# Patient Record
Sex: Male | Born: 1935 | Race: White | Hispanic: No | Marital: Married | Smoking: Former smoker
Health system: Southern US, Community
[De-identification: ages and names within clinical notes are randomized; demographics above are authoritative.]

## PROBLEM LIST (undated history)

## (undated) DIAGNOSIS — I429 Cardiomyopathy, unspecified: Secondary | ICD-10-CM

## (undated) DIAGNOSIS — M1990 Unspecified osteoarthritis, unspecified site: Secondary | ICD-10-CM

## (undated) DIAGNOSIS — K529 Noninfective gastroenteritis and colitis, unspecified: Secondary | ICD-10-CM

## (undated) DIAGNOSIS — F32A Depression, unspecified: Secondary | ICD-10-CM

## (undated) DIAGNOSIS — R001 Bradycardia, unspecified: Secondary | ICD-10-CM

## (undated) DIAGNOSIS — I509 Heart failure, unspecified: Secondary | ICD-10-CM

## (undated) DIAGNOSIS — E559 Vitamin D deficiency, unspecified: Secondary | ICD-10-CM

## (undated) DIAGNOSIS — F329 Major depressive disorder, single episode, unspecified: Secondary | ICD-10-CM

## (undated) DIAGNOSIS — I4891 Unspecified atrial fibrillation: Secondary | ICD-10-CM

## (undated) DIAGNOSIS — J189 Pneumonia, unspecified organism: Secondary | ICD-10-CM

## (undated) DIAGNOSIS — Z950 Presence of cardiac pacemaker: Secondary | ICD-10-CM

## (undated) DIAGNOSIS — I4821 Permanent atrial fibrillation: Secondary | ICD-10-CM

## (undated) DIAGNOSIS — I340 Nonrheumatic mitral (valve) insufficiency: Secondary | ICD-10-CM

## (undated) DIAGNOSIS — I10 Essential (primary) hypertension: Secondary | ICD-10-CM

## (undated) DIAGNOSIS — S065X9A Traumatic subdural hemorrhage with loss of consciousness of unspecified duration, initial encounter: Secondary | ICD-10-CM

## (undated) HISTORY — DX: Essential (primary) hypertension: I10

## (undated) HISTORY — PX: HAND SURGERY: SHX662

## (undated) HISTORY — DX: Permanent atrial fibrillation: I48.21

## (undated) HISTORY — DX: Noninfective gastroenteritis and colitis, unspecified: K52.9

## (undated) HISTORY — DX: Bradycardia, unspecified: R00.1

## (undated) HISTORY — DX: Traumatic subdural hemorrhage with loss of consciousness of unspecified duration, initial encounter: S06.5X9A

## (undated) HISTORY — PX: TONSILLECTOMY AND ADENOIDECTOMY: SUR1326

## (undated) HISTORY — PX: SUBDURAL HEMATOMA EVACUATION VIA CRANIOTOMY: SUR319

## (undated) HISTORY — PX: CYSTECTOMY: SUR359

## (undated) HISTORY — PX: INSERT / REPLACE / REMOVE PACEMAKER: SUR710

---

## 1999-07-06 ENCOUNTER — Inpatient Hospital Stay (HOSPITAL_COMMUNITY): Admission: EM | Admit: 1999-07-06 | Discharge: 1999-07-09 | Payer: Self-pay | Admitting: Emergency Medicine

## 1999-07-06 ENCOUNTER — Encounter: Payer: Self-pay | Admitting: Emergency Medicine

## 1999-07-08 ENCOUNTER — Encounter: Payer: Self-pay | Admitting: Endocrinology

## 2000-04-10 ENCOUNTER — Ambulatory Visit (HOSPITAL_COMMUNITY): Admission: RE | Admit: 2000-04-10 | Discharge: 2000-04-10 | Payer: Self-pay | Admitting: Specialist

## 2000-04-24 ENCOUNTER — Inpatient Hospital Stay (HOSPITAL_COMMUNITY): Admission: EM | Admit: 2000-04-24 | Discharge: 2000-04-24 | Payer: Self-pay | Admitting: Emergency Medicine

## 2000-09-08 ENCOUNTER — Encounter: Payer: Self-pay | Admitting: Specialist

## 2000-09-08 ENCOUNTER — Encounter: Admission: RE | Admit: 2000-09-08 | Discharge: 2000-09-08 | Payer: Self-pay | Admitting: Specialist

## 2001-03-09 ENCOUNTER — Encounter: Admission: RE | Admit: 2001-03-09 | Discharge: 2001-04-27 | Payer: Self-pay | Admitting: Specialist

## 2001-03-16 ENCOUNTER — Encounter (INDEPENDENT_AMBULATORY_CARE_PROVIDER_SITE_OTHER): Payer: Self-pay | Admitting: Specialist

## 2001-03-16 ENCOUNTER — Other Ambulatory Visit: Admission: RE | Admit: 2001-03-16 | Discharge: 2001-03-16 | Payer: Self-pay | Admitting: Gastroenterology

## 2001-05-26 DIAGNOSIS — S065X9A Traumatic subdural hemorrhage with loss of consciousness of unspecified duration, initial encounter: Secondary | ICD-10-CM

## 2001-05-26 DIAGNOSIS — S065XAA Traumatic subdural hemorrhage with loss of consciousness status unknown, initial encounter: Secondary | ICD-10-CM

## 2001-05-26 HISTORY — DX: Traumatic subdural hemorrhage with loss of consciousness status unknown, initial encounter: S06.5XAA

## 2001-05-26 HISTORY — DX: Traumatic subdural hemorrhage with loss of consciousness of unspecified duration, initial encounter: S06.5X9A

## 2001-10-23 ENCOUNTER — Emergency Department (HOSPITAL_COMMUNITY): Admission: EM | Admit: 2001-10-23 | Discharge: 2001-10-24 | Payer: Self-pay | Admitting: Emergency Medicine

## 2002-04-25 ENCOUNTER — Inpatient Hospital Stay (HOSPITAL_COMMUNITY): Admission: EM | Admit: 2002-04-25 | Discharge: 2002-05-03 | Payer: Self-pay | Admitting: Emergency Medicine

## 2002-04-25 ENCOUNTER — Encounter: Payer: Self-pay | Admitting: Emergency Medicine

## 2002-04-25 ENCOUNTER — Encounter: Payer: Self-pay | Admitting: Neurosurgery

## 2002-04-26 ENCOUNTER — Encounter (INDEPENDENT_AMBULATORY_CARE_PROVIDER_SITE_OTHER): Payer: Self-pay | Admitting: *Deleted

## 2002-04-29 ENCOUNTER — Encounter: Payer: Self-pay | Admitting: Neurosurgery

## 2002-05-03 ENCOUNTER — Inpatient Hospital Stay (HOSPITAL_COMMUNITY)
Admission: RE | Admit: 2002-05-03 | Discharge: 2002-05-12 | Payer: Self-pay | Admitting: Physical Medicine & Rehabilitation

## 2002-05-12 ENCOUNTER — Inpatient Hospital Stay (HOSPITAL_COMMUNITY): Admission: EM | Admit: 2002-05-12 | Discharge: 2002-05-23 | Payer: Self-pay | Admitting: Cardiology

## 2002-05-23 ENCOUNTER — Inpatient Hospital Stay (HOSPITAL_COMMUNITY)
Admission: RE | Admit: 2002-05-23 | Discharge: 2002-05-31 | Payer: Self-pay | Admitting: Physical Medicine & Rehabilitation

## 2003-09-27 ENCOUNTER — Ambulatory Visit (HOSPITAL_COMMUNITY): Admission: RE | Admit: 2003-09-27 | Discharge: 2003-09-27 | Payer: Self-pay | Admitting: *Deleted

## 2003-10-10 ENCOUNTER — Encounter: Admission: RE | Admit: 2003-10-10 | Discharge: 2003-10-10 | Payer: Self-pay | Admitting: *Deleted

## 2003-10-11 ENCOUNTER — Encounter (INDEPENDENT_AMBULATORY_CARE_PROVIDER_SITE_OTHER): Payer: Self-pay | Admitting: Specialist

## 2003-10-11 ENCOUNTER — Ambulatory Visit (HOSPITAL_BASED_OUTPATIENT_CLINIC_OR_DEPARTMENT_OTHER): Admission: RE | Admit: 2003-10-11 | Discharge: 2003-10-11 | Payer: Self-pay | Admitting: *Deleted

## 2003-10-11 ENCOUNTER — Ambulatory Visit (HOSPITAL_COMMUNITY): Admission: RE | Admit: 2003-10-11 | Discharge: 2003-10-11 | Payer: Self-pay | Admitting: *Deleted

## 2004-01-13 ENCOUNTER — Emergency Department (HOSPITAL_COMMUNITY): Admission: EM | Admit: 2004-01-13 | Discharge: 2004-01-13 | Payer: Self-pay | Admitting: Emergency Medicine

## 2004-06-05 ENCOUNTER — Ambulatory Visit: Payer: Self-pay | Admitting: Internal Medicine

## 2004-09-10 ENCOUNTER — Ambulatory Visit: Payer: Self-pay | Admitting: Internal Medicine

## 2004-09-25 ENCOUNTER — Ambulatory Visit: Payer: Self-pay | Admitting: Gastroenterology

## 2004-09-25 ENCOUNTER — Ambulatory Visit (HOSPITAL_COMMUNITY): Admission: RE | Admit: 2004-09-25 | Discharge: 2004-09-25 | Payer: Self-pay | Admitting: Gastroenterology

## 2004-12-10 ENCOUNTER — Ambulatory Visit: Payer: Self-pay | Admitting: Internal Medicine

## 2005-03-10 ENCOUNTER — Ambulatory Visit: Payer: Self-pay | Admitting: Internal Medicine

## 2005-09-11 ENCOUNTER — Encounter: Admission: RE | Admit: 2005-09-11 | Discharge: 2005-10-13 | Payer: Self-pay | Admitting: Neurology

## 2005-09-17 ENCOUNTER — Ambulatory Visit: Payer: Self-pay | Admitting: Gastroenterology

## 2005-10-14 ENCOUNTER — Encounter: Admission: RE | Admit: 2005-10-14 | Discharge: 2006-01-12 | Payer: Self-pay | Admitting: Neurology

## 2005-11-24 ENCOUNTER — Ambulatory Visit: Payer: Self-pay

## 2006-06-08 ENCOUNTER — Ambulatory Visit: Payer: Self-pay | Admitting: Internal Medicine

## 2006-06-08 ENCOUNTER — Ambulatory Visit: Payer: Self-pay

## 2006-06-30 ENCOUNTER — Ambulatory Visit: Payer: Self-pay | Admitting: Gastroenterology

## 2006-07-11 ENCOUNTER — Ambulatory Visit: Payer: Self-pay | Admitting: Internal Medicine

## 2006-07-22 ENCOUNTER — Ambulatory Visit: Payer: Self-pay | Admitting: Gastroenterology

## 2006-07-22 ENCOUNTER — Encounter (INDEPENDENT_AMBULATORY_CARE_PROVIDER_SITE_OTHER): Payer: Self-pay | Admitting: *Deleted

## 2006-08-05 ENCOUNTER — Ambulatory Visit: Payer: Self-pay | Admitting: Internal Medicine

## 2006-09-02 ENCOUNTER — Ambulatory Visit: Payer: Self-pay | Admitting: Internal Medicine

## 2006-10-07 ENCOUNTER — Ambulatory Visit: Payer: Self-pay | Admitting: Internal Medicine

## 2006-11-04 ENCOUNTER — Ambulatory Visit: Payer: Self-pay | Admitting: Internal Medicine

## 2006-12-02 ENCOUNTER — Ambulatory Visit: Payer: Self-pay | Admitting: Internal Medicine

## 2007-01-06 ENCOUNTER — Ambulatory Visit: Payer: Self-pay | Admitting: Internal Medicine

## 2007-01-21 ENCOUNTER — Encounter: Admission: RE | Admit: 2007-01-21 | Discharge: 2007-04-21 | Payer: Self-pay | Admitting: Neurology

## 2007-02-03 ENCOUNTER — Ambulatory Visit: Payer: Self-pay | Admitting: Internal Medicine

## 2007-03-03 ENCOUNTER — Ambulatory Visit: Payer: Self-pay | Admitting: Internal Medicine

## 2007-04-07 ENCOUNTER — Ambulatory Visit: Payer: Self-pay | Admitting: Internal Medicine

## 2007-05-05 ENCOUNTER — Ambulatory Visit: Payer: Self-pay | Admitting: Internal Medicine

## 2007-06-01 ENCOUNTER — Encounter: Admission: RE | Admit: 2007-06-01 | Discharge: 2007-08-30 | Payer: Self-pay | Admitting: Orthopedic Surgery

## 2007-06-02 ENCOUNTER — Ambulatory Visit: Payer: Self-pay

## 2007-07-07 ENCOUNTER — Ambulatory Visit: Payer: Self-pay | Admitting: Internal Medicine

## 2007-09-27 ENCOUNTER — Encounter: Payer: Self-pay | Admitting: Gastroenterology

## 2007-10-06 ENCOUNTER — Ambulatory Visit: Payer: Self-pay | Admitting: Internal Medicine

## 2007-10-11 ENCOUNTER — Encounter: Admission: RE | Admit: 2007-10-11 | Discharge: 2007-10-11 | Payer: Self-pay | Admitting: Specialist

## 2007-11-25 ENCOUNTER — Ambulatory Visit: Payer: Self-pay | Admitting: Internal Medicine

## 2007-12-10 ENCOUNTER — Ambulatory Visit: Payer: Self-pay

## 2007-12-10 ENCOUNTER — Encounter: Payer: Self-pay | Admitting: Internal Medicine

## 2007-12-23 ENCOUNTER — Emergency Department (HOSPITAL_BASED_OUTPATIENT_CLINIC_OR_DEPARTMENT_OTHER): Admission: EM | Admit: 2007-12-23 | Discharge: 2007-12-23 | Payer: Self-pay | Admitting: Emergency Medicine

## 2007-12-30 ENCOUNTER — Emergency Department (HOSPITAL_BASED_OUTPATIENT_CLINIC_OR_DEPARTMENT_OTHER): Admission: EM | Admit: 2007-12-30 | Discharge: 2007-12-30 | Payer: Self-pay | Admitting: Emergency Medicine

## 2008-01-19 ENCOUNTER — Ambulatory Visit: Payer: Self-pay | Admitting: Internal Medicine

## 2008-01-24 ENCOUNTER — Ambulatory Visit: Payer: Self-pay | Admitting: Internal Medicine

## 2008-01-24 LAB — CONVERTED CEMR LAB
Creatinine, Ser: 1 mg/dL (ref 0.4–1.5)
Eosinophils Relative: 1.3 % (ref 0.0–5.0)
GFR calc Af Amer: 92 mL/min
Glucose, Bld: 88 mg/dL (ref 70–99)
Hemoglobin: 15.1 g/dL (ref 13.0–17.0)
INR: 1.1 — ABNORMAL HIGH (ref 0.8–1.0)
MCV: 92.7 fL (ref 78.0–100.0)
Monocytes Relative: 10.3 % (ref 3.0–12.0)
Prothrombin Time: 12.6 s (ref 10.9–13.3)
RBC: 4.76 M/uL (ref 4.22–5.81)
RDW: 12.8 % (ref 11.5–14.6)
Sodium: 139 meq/L (ref 135–145)
WBC: 7.2 10*3/uL (ref 4.5–10.5)
aPTT: 26.8 s (ref 21.7–29.8)

## 2008-01-25 ENCOUNTER — Inpatient Hospital Stay (HOSPITAL_BASED_OUTPATIENT_CLINIC_OR_DEPARTMENT_OTHER): Admission: RE | Admit: 2008-01-25 | Discharge: 2008-01-25 | Payer: Self-pay | Admitting: Cardiovascular Disease

## 2008-01-25 ENCOUNTER — Ambulatory Visit: Payer: Self-pay | Admitting: Cardiovascular Disease

## 2008-02-09 ENCOUNTER — Ambulatory Visit: Payer: Self-pay | Admitting: Internal Medicine

## 2008-02-09 LAB — CONVERTED CEMR LAB
BUN: 20 mg/dL (ref 6–23)
CO2: 31 meq/L (ref 19–32)
Chloride: 102 meq/L (ref 96–112)
Creatinine, Ser: 1 mg/dL (ref 0.4–1.5)
Sodium: 139 meq/L (ref 135–145)

## 2008-02-22 ENCOUNTER — Ambulatory Visit: Payer: Self-pay | Admitting: Internal Medicine

## 2008-02-29 ENCOUNTER — Ambulatory Visit: Payer: Self-pay | Admitting: Internal Medicine

## 2008-03-23 DIAGNOSIS — I428 Other cardiomyopathies: Secondary | ICD-10-CM

## 2008-03-23 DIAGNOSIS — I4891 Unspecified atrial fibrillation: Secondary | ICD-10-CM

## 2008-03-23 DIAGNOSIS — Z950 Presence of cardiac pacemaker: Secondary | ICD-10-CM

## 2008-03-23 DIAGNOSIS — I442 Atrioventricular block, complete: Secondary | ICD-10-CM | POA: Insufficient documentation

## 2008-06-23 ENCOUNTER — Encounter (INDEPENDENT_AMBULATORY_CARE_PROVIDER_SITE_OTHER): Payer: Self-pay | Admitting: *Deleted

## 2008-07-10 ENCOUNTER — Ambulatory Visit: Payer: Self-pay | Admitting: Internal Medicine

## 2008-08-31 ENCOUNTER — Ambulatory Visit: Payer: Self-pay | Admitting: Internal Medicine

## 2008-08-31 ENCOUNTER — Encounter: Payer: Self-pay | Admitting: Internal Medicine

## 2008-08-31 LAB — CONVERTED CEMR LAB
Basophils Absolute: 0.1 10*3/uL (ref 0.0–0.1)
Basophils Relative: 0.7 % (ref 0.0–3.0)
Eosinophils Absolute: 0 10*3/uL (ref 0.0–0.7)
Eosinophils Relative: 0.6 % (ref 0.0–5.0)
HCT: 41.6 % (ref 39.0–52.0)
Hemoglobin: 14.2 g/dL (ref 13.0–17.0)
Lymphocytes Relative: 21.4 % (ref 12.0–46.0)
Lymphs Abs: 1.6 10*3/uL (ref 0.7–4.0)
MCHC: 34 g/dL (ref 30.0–36.0)
MCV: 93.6 fL (ref 78.0–100.0)
Monocytes Absolute: 0.8 10*3/uL (ref 0.1–1.0)
Monocytes Relative: 11.3 % (ref 3.0–12.0)
Neutro Abs: 4.8 10*3/uL (ref 1.4–7.7)
Neutrophils Relative %: 66 % (ref 43.0–77.0)
Platelets: 206 10*3/uL (ref 150.0–400.0)
RBC: 4.45 M/uL (ref 4.22–5.81)
RDW: 13.9 % (ref 11.5–14.6)
Sed Rate: 9 mm/hr (ref 0–22)
WBC: 7.3 10*3/uL (ref 4.5–10.5)

## 2008-11-29 ENCOUNTER — Telehealth: Payer: Self-pay | Admitting: Internal Medicine

## 2008-11-29 ENCOUNTER — Ambulatory Visit: Payer: Self-pay | Admitting: Internal Medicine

## 2008-12-27 ENCOUNTER — Ambulatory Visit: Payer: Self-pay | Admitting: Internal Medicine

## 2009-01-24 ENCOUNTER — Ambulatory Visit: Payer: Self-pay | Admitting: Internal Medicine

## 2009-02-28 ENCOUNTER — Ambulatory Visit: Payer: Self-pay | Admitting: Internal Medicine

## 2009-03-14 ENCOUNTER — Telehealth: Payer: Self-pay | Admitting: Internal Medicine

## 2009-03-28 ENCOUNTER — Ambulatory Visit: Payer: Self-pay | Admitting: Internal Medicine

## 2009-04-23 ENCOUNTER — Telehealth: Payer: Self-pay | Admitting: Internal Medicine

## 2009-04-25 ENCOUNTER — Ambulatory Visit: Payer: Self-pay | Admitting: Internal Medicine

## 2009-05-01 ENCOUNTER — Ambulatory Visit: Payer: Self-pay | Admitting: Internal Medicine

## 2009-05-01 DIAGNOSIS — I5032 Chronic diastolic (congestive) heart failure: Secondary | ICD-10-CM

## 2009-05-02 ENCOUNTER — Ambulatory Visit: Payer: Self-pay | Admitting: Cardiology

## 2009-05-02 ENCOUNTER — Encounter: Payer: Self-pay | Admitting: Internal Medicine

## 2009-05-02 ENCOUNTER — Ambulatory Visit (HOSPITAL_COMMUNITY): Admission: RE | Admit: 2009-05-02 | Discharge: 2009-05-02 | Payer: Self-pay | Admitting: Internal Medicine

## 2009-05-02 ENCOUNTER — Ambulatory Visit: Payer: Self-pay

## 2009-05-04 ENCOUNTER — Telehealth (INDEPENDENT_AMBULATORY_CARE_PROVIDER_SITE_OTHER): Payer: Self-pay | Admitting: *Deleted

## 2009-05-07 ENCOUNTER — Ambulatory Visit: Payer: Self-pay | Admitting: Internal Medicine

## 2009-05-08 LAB — CONVERTED CEMR LAB
BUN: 20 mg/dL (ref 6–23)
CO2: 33 meq/L — ABNORMAL HIGH (ref 19–32)
Chloride: 100 meq/L (ref 96–112)
Eosinophils Absolute: 0.1 10*3/uL (ref 0.0–0.7)
Eosinophils Relative: 1.5 % (ref 0.0–5.0)
GFR calc non Af Amer: 75.75 mL/min (ref >60)
HCT: 37 % — ABNORMAL LOW (ref 39.0–52.0)
Lymphs Abs: 1.1 10*3/uL (ref 0.7–4.0)
MCV: 95.2 fL (ref 78.0–100.0)
Monocytes Absolute: 0.7 10*3/uL (ref 0.1–1.0)
Neutrophils Relative %: 73.5 % (ref 43.0–77.0)
RBC: 3.88 M/uL — ABNORMAL LOW (ref 4.22–5.81)
WBC: 7.4 10*3/uL (ref 4.5–10.5)

## 2009-05-10 ENCOUNTER — Ambulatory Visit: Payer: Self-pay | Admitting: Internal Medicine

## 2009-05-10 ENCOUNTER — Ambulatory Visit (HOSPITAL_COMMUNITY): Admission: RE | Admit: 2009-05-10 | Discharge: 2009-05-10 | Payer: Self-pay | Admitting: Internal Medicine

## 2009-05-11 ENCOUNTER — Encounter: Payer: Self-pay | Admitting: Internal Medicine

## 2009-05-23 ENCOUNTER — Encounter: Payer: Self-pay | Admitting: Internal Medicine

## 2009-05-23 ENCOUNTER — Ambulatory Visit: Payer: Self-pay

## 2009-07-02 ENCOUNTER — Encounter: Admission: RE | Admit: 2009-07-02 | Discharge: 2009-08-16 | Payer: Self-pay | Admitting: Internal Medicine

## 2009-08-20 ENCOUNTER — Encounter: Payer: Self-pay | Admitting: Internal Medicine

## 2009-08-21 ENCOUNTER — Ambulatory Visit: Payer: Self-pay | Admitting: Internal Medicine

## 2009-10-26 ENCOUNTER — Telehealth: Payer: Self-pay | Admitting: Gastroenterology

## 2009-11-15 ENCOUNTER — Emergency Department (HOSPITAL_COMMUNITY): Admission: EM | Admit: 2009-11-15 | Discharge: 2009-11-15 | Payer: Self-pay | Admitting: Emergency Medicine

## 2010-02-25 ENCOUNTER — Encounter: Payer: Self-pay | Admitting: Internal Medicine

## 2010-02-26 ENCOUNTER — Ambulatory Visit: Payer: Self-pay | Admitting: Internal Medicine

## 2010-05-30 ENCOUNTER — Ambulatory Visit
Admission: RE | Admit: 2010-05-30 | Discharge: 2010-05-30 | Payer: Self-pay | Source: Home / Self Care | Attending: Internal Medicine | Admitting: Internal Medicine

## 2010-06-02 ENCOUNTER — Encounter: Payer: Self-pay | Admitting: Internal Medicine

## 2010-06-04 ENCOUNTER — Telehealth: Payer: Self-pay | Admitting: Internal Medicine

## 2010-06-16 ENCOUNTER — Encounter: Payer: Self-pay | Admitting: *Deleted

## 2010-06-25 NOTE — Cardiovascular Report (Signed)
Summary: Office Visit   Office Visit   Imported By: Roderic Ovens 03/04/2010 11:24:09  _____________________________________________________________________  External Attachment:    Type:   Image     Comment:   External Document

## 2010-06-25 NOTE — Cardiovascular Report (Signed)
Summary: Office Visit   Office Visit   Imported By: Roderic Ovens 06/07/2009 12:36:12  _____________________________________________________________________  External Attachment:    Type:   Image     Comment:   External Document

## 2010-06-25 NOTE — Assessment & Plan Note (Signed)
Summary: pc2/jml   CC:  pc2.  Marland Kitchen  History of Present Illness:   [name] is seen in followup for pacemaker implanted for bradycardia in the setting of atrial fibrillation.  He has status post AV junction ablation with resumption of antegrade conduction.  He has complaints of some modest dyspnea with exertion.   He saw Dr [name] recently who noted significan t edema and put him on a diuretic with some but not complete benefit interestingly echo from the fall of 2010 prior to generator replacement demonstrated ejection fraction that was normal and this is severe biatrial enlargement and moderate mitral regurgitation. This shows a significant improvement in LV function not withstand the presence of persistent mitral regurgitation  In December he underwent device generator replacement.  He has done well since then without chest pain. Shortness of breath is better. He continues to struggle with edema.     Current Medications (verified): 1)  Digoxin 0.125 Mg Tabs (Digoxin) .Marland Kitchen.. 1 Tab Once Daily 2)  Carvedilol 25 Mg Tabs (Carvedilol) .... Take 1 Tab Two Times A Day 3)  Enalapril Maleate 10 Mg Tabs (Enalapril Maleate) .Marland Kitchen.. 1 Tab Once Daily 4)  Aspirin Adult Low Strength 81 Mg Tbec (Aspirin) .... Once Daily 5)  Vitamin D 04540 Unit Caps (Ergocalciferol) .... Take 1 Cap 1 Day Per Week, Sundays 6)  Hydrocodone-Acetaminophen 5-325 Mg Tabs (Hydrocodone-Acetaminophen) .... Take 1/2 Tab Two Times A Day As Needed 7)  Lexapro 10 Mg Tabs (Escitalopram Oxalate) .... Take One Tablet Once Daily 8)  Multi Vit .... Once Daily 9)  Furosemide 40 Mg Tabs (Furosemide) .... Two Times A Day 10)  Potassium Chloride Cr 10 Meq Cr-Tabs (Potassium Chloride) .... Take One Tablet Once Daily 11)  Vitamin D 50,000 Iu .... Once Daily 12)  Voltaren 1 % Gel (Diclofenac Sodium) .... 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  Alcohol Use - no Children  Vital Signs:  Patient profile:   77 year old male Height:      71 inches Weight:      20 4 pounds Pulse rate:   75 / minute Pulse rhythm:   regular BP sitting:   127 / 73  (left arm) Cuff size:   large  Vitals Entered By: Judithe Modest CMA (August 21, 2009 11:08 AM)  Physical Exam  General:  The patient was alert and oriented in no acute distress. HEENT Normal.  Neck veins were flat, carotids were brisk.  Lungs were clear.  Heart sounds were regular without murmurs or gallops.  Abdomen was soft with active bowel sounds. There is no clubbing cyanosis; bilateral brawny edema. The skin was warm and drythe pacemaker pocket is well-healed    PPM Specifications Following MD:  Sherryl Manges, MD     [facility] Vendor:  St Jude     PPM Model Number:  [id number]     [facility] Serial Number:  [id number] PPM DOI:  05/10/2009     PPM Implanting MD:  Viviann Spare  Graciela Husbands, MD  Lead 1    Location: RA     DOI: 05/18/2002     Model #: [id number]     Serial #: [id number]     Status: active Lead 2    Location: RV     DOI: 05/18/2002     Model #: [id number]     Serial #: [id number]     Status: active  Magnet Response Rate:  BOL 98.6 ERI 86.3  Indications:  A-fib with AV node ablation  Explantation Comments:  05/10/2009 St. Jude [id number] explanted.  PPM Follow Up Remote Check?  No Battery Voltage:  2.99 V     Battery Est. Longevity:  10.6 years      Pacer Dependent:  No     Right Ventricle  Amplitude: 11.1 mV, Impedance: 490 ohms, Threshold: 1.0 V at 0.4 msec  Episodes Coumadin:  No Ventricular Pacing:  61%  Parameters Mode:  VVIR     Lower Rate Limit:  70     Upper Rate Limit:  120 Next Cardiology Appt Due:  04/25/2010 Tech Comments:  No parameter changes.  A-fib, - coumadin.   ROV 12/11 with Dr.Klein. Altha Harm, LPN  August 21, 2009 11:24 AM   Impression & Recommendations:  Problem # 1:  SYSTOLIC HEART FAILURE, CHRONIC (ICD-428.22) his breathing is better his edema remains a problem. He'll continue on diuretics His updated medication list for this problem includes:    Digoxin 0.125 Mg Tabs (Digoxin) .Marland Kitchen... 1 tab once daily    Carvedilol 25 Mg Tabs (Carvedilol) .Marland Kitchen... Take 1 tab two times a day    Enalapril Maleate 10 Mg Tabs (Enalapril maleate) .Marland Kitchen... 1 tab once daily    Aspirin Adult Low Strength 81 Mg Tbec (Aspirin) ..... Once daily    Furosemide 40 Mg Tabs (Furosemide) .Marland Kitchen..Marland Kitchen Two times a day  Problem # 2:  CARDIAC PACEMAKER IN SITU (ICD-V45.01) Device parameters and data were reviewed and no changes were made  Problem # 3:  CARDIOMYOPATHY, PRIMARY, DILATED (ICD-425.4) His significant valvular disease is stable His updated medication list for this problem includes:    Digoxin 0.125 Mg Tabs (Digoxin) .Marland Kitchen... 1 tab once daily    Carvedilol 25 Mg Tabs (Carvedilol) .Marland Kitchen... Take 1 tab two times a day    Enalapril Maleate 10 Mg Tabs (Enalapril maleate) .Marland Kitchen... 1 tab once daily    Aspirin Adult Low Strength 81 Mg Tbec (Aspirin) ..... Once daily    Furosemide 40 Mg Tabs (Furosemide) .Marland Kitchen..Marland Kitchen Two times a day

## 2010-06-25 NOTE — Cardiovascular Report (Signed)
Summary: TTM  TTM   Imported By: Roderic Ovens 06/12/2009 10:37:36  _____________________________________________________________________  External Attachment:    Type:   Image     Comment:   External Document

## 2010-06-25 NOTE — Miscellaneous (Signed)
  Clinical Lists Changes  Observations: Added new observation of ECHOINTERP:  - Left ventricle: The cavity size was normal. There was moderate       concentric hypertrophy. Systolic function was normal. The       estimated ejection fraction was in the range of 50% to 55%. Wall       motion was normal; there were no regional wall motion       abnormalities.     - Aortic valve: Mild regurgitation.     - Mitral valve: Mild prolapse, involving the posterior leaflet.       Moderate regurgitation.     - Left atrium: The atrium was severely dilated.     - Right atrium: The atrium was severely dilated.     - Tricuspid valve: Moderate regurgitation.     - Pulmonary arteries: Systolic pressure was mildly increased. PA       peak pressure: 45mm Hg (S).     Impressions:            - Prolapse of posterior MV leaflet with eccentric, anteriorly       directed MR; difficult to quantitate due to eccentric nature of       jet but appears moderate; suggest TEE to further evaluate if       clinically indicated. (05/02/2009 14:19)      Echocardiogram  Procedure date:  05/02/2009  Findings:       - Left ventricle: The cavity size was normal. There was moderate       concentric hypertrophy. Systolic function was normal. The       estimated ejection fraction was in the range of 50% to 55%. Wall       motion was normal; there were no regional wall motion       abnormalities.     - Aortic valve: Mild regurgitation.     - Mitral valve: Mild prolapse, involving the posterior leaflet.       Moderate regurgitation.     - Left atrium: The atrium was severely dilated.     - Right atrium: The atrium was severely dilated.     - Tricuspid valve: Moderate regurgitation.     - Pulmonary arteries: Systolic pressure was mildly increased. PA       peak pressure: 45mm Hg (S).     Impressions:            - Prolapse of posterior MV leaflet with eccentric, anteriorly       directed MR; difficult to quantitate  due to eccentric nature of       jet but appears moderate; suggest TEE to further evaluate if       clinically indicated.

## 2010-06-25 NOTE — Assessment & Plan Note (Signed)
Summary: pacer [email or web address]   Visit Type:  pacer check   History of Present Illness:   [name] is seen in followup for pacemaker implanted for bradycardia in the setting of atrial fibrillation.  He has status post AV junction ablation with resumption of antegrade conduction.  He has complaints of some modest dyspnea with exertion.     echo from the fall of 2010 prior to generator replacement demonstrated ejection fraction that was normal and this is severe biatrial enlargement and moderate mitral regurgitation. This shows a significant improvement in LV function not withstand the presence of persistent mitral regurgitation  In December he underwent device generator replacement.   He has done well since then without chest pain. Shortness of breath is better. He continues to struggle with edema but Dr. Elwin Mocha came upon an impressive solution with compressive stockings boots and wraps.he continues to wear compression stockings with a moderate amount of success. He struggles with some nocturia. They're trying to adjust his Lasix and I suggested that they taken earlier in the day.     Current Medications (verified): 1)  Digoxin 0.125 Mg Tabs (Digoxin) .Marland Kitchen.. 1 Tab Once Daily 2)  Carvedilol 25 Mg Tabs (Carvedilol) .... Take 1 Tab Two Times A Day 3)  Enalapril Maleate 10 Mg Tabs (Enalapril Maleate) .Marland Kitchen.. 1 Tab Once Daily 4)  Aspirin Adult Low Strength 81 Mg Tbec (Aspirin) .... Once Daily 5)  Vitamin D 04540 Unit Caps (Ergocalciferol) .... Take 1 Cap 1 Day Per Week, Sundays 6)  Hydrocodone-Acetaminophen 5-325 Mg Tabs (Hydrocodone-Acetaminophen) .... Take 1/2 Tab Two Times A Day As Needed 7)  Lexapro 10 Mg Tabs (Escitalopram Oxalate) .... Take One Tablet Once Daily 8)  Multi Vit .... Once Daily 9)  Furosemide 40 Mg Tabs (Furosemide) .... Take 1 Tablet By Mouth Once A Day 10)  Potassium Chloride Cr 10 Meq Cr-Tabs (Potassium Chloride) .... Take One Tablet Once Daily 11)  Voltaren 1 % Gel  (Diclofenac Sodium) .... As Needed 12)  Glucosamine-Chondroitin  Caps (Glucosamine-Chondroit-Vit C-Mn) .... Take 1 Capsule By Mouth Once A Day 13)  Fish Oil 1000 Mg Caps (Omega-3 Fatty Acids) .... Take 1 Capsule By Mouth Once A Day  Allergies: 1)  Celebrex (Celecoxib) 2)  * Vioxx (Rofecoxib)  Past History:  Past Medical History: Last updated: 02/25/2010 Permanent Atrial Fibrillation NOT candidate coumadin Bradycardia AV junction Ablation with resumption on conduction s/p St Judes Pacemaker-2011 revision/St. Jude Accent SR Hypertension Subdural Hematoma--on coumadin Colitis  Past Surgical History: Last updated: 02/25/2010 Craniotomy for subdural hematoma Breast cystectomy Neck Cystectomy Hand Surgery Pacemaker implant 2003-2010 revision-St Jude Accent SR  Family History: Last updated: 03/23/2008 Negative FH of Diabetes, Hypertension, or Coronary Artery Disease  Social History: Last updated: 03/23/2008 Married  Tobacco Use - No.  Alcohol Use - no Children  Vital Signs:  Patient profile:   77 year old male Height:      71 inches Weight:      183.75 pounds BMI:     25.72 Pulse rate:   79 / minute Pulse rhythm:   regular Resp:     18  per minute BP sitting:   123 / 75  (right arm) Cuff size:   large  Vitals Entered By: Vikki Ports (February 26, 2010 3:09 PM)  Physical Exam  General:  The patient was alert and oriented in no acute distress. HEENT Normal.  Neck veins were flat, carotids were brisk.  Lungs were clear.  Heart sounds were regular without murmurs or gallops.  Abdomen was soft with active bowel sounds. There is no clubbing cyanosis; 2+ edema with compressive stockings Skin Warm and dry    PPM Specifications Following MD:  Sherryl Manges, MD     PPM Vendor:  St Jude     PPM Model Number:  [id number]     PPM Serial Number:  [id number] PPM DOI:  05/10/2009     PPM Implanting MD:  Sherryl Manges, MD  Lead 1    Location: RA     DOI: 05/18/2002     Model #:  1688TC     Serial #: [id number]     Status: active Lead 2    Location: RV     DOI: 05/18/2002     Model #: 0865     Serial #: [id number]     Status: active  Magnet Response Rate:  BOL 98.6 ERI 86.3  Indications:  A-fib with AV node ablation  Explantation Comments:  05/10/2009 St. Jude [id number] explanted.  PPM Follow Up Remote Check?  No Battery Voltage:  2.98 V     Battery Est. Longevity:  11 years     Pacer Dependent:  No     Right Ventricle  Amplitude: 12 mV, Impedance: 530 ohms, Threshold: 1.0 V at 0.4 msec  Episodes Coumadin:  No Ventricular Pacing:  65%  Parameters Mode:  VVIR     Lower Rate Limit:  70     Upper Rate Limit:  120 Next Remote Date:  05/30/2010     Next Cardiology Appt Due:  02/24/2011 Tech Comments:  Auto capture on.  Merlin transmissions every 3 months.  A-fib with controlled ventricular rates, - coumadin.   ROV 1 year with [name]. Altha Harm, LPN  February 26, 2010 3:22 PM   Impression & Recommendations:  Problem # 1:  ATRIAL FIBRILLATION (ICD-427.31) permanent but not on Coumadin because of contraindication His updated medication list for this problem includes:    Digoxin 0.125 Mg Tabs (Digoxin) .Marland Kitchen... 1 tab once daily    Carvedilol 25 Mg Tabs (Carvedilol) .Marland Kitchen... Take 1 tab two times a day    Aspirin Adult Low Strength 81 Mg Tbec (Aspirin) ..... Once daily  Problem # 2:  DIASTOLIC HEART FAILURE, CHRONIC (ICD-428.32) stable; continue diuretics with the aforementioned medication adjustments His updated medication list for this problem includes:    Digoxin 0.125 Mg Tabs (Digoxin) .Marland Kitchen... 1 tab once daily    Carvedilol 25 Mg Tabs (Carvedilol) .Marland Kitchen... Take 1 tab two times a day    Enalapril Maleate 10 Mg Tabs (Enalapril maleate) .Marland Kitchen... 1 tab once daily    Aspirin Adult Low Strength 81 Mg Tbec (Aspirin) ..... Once daily    Furosemide 40 Mg Tabs (Furosemide) .Marland Kitchen... Take 1 tablet by mouth once a day  Problem # 3:  CARDIAC PACEMAKER IN SITU (ICD-V45.01) Device  parameters and data were reviewed and no changes were made  Problem # 4:  AV BLOCK, COMPLETE (ICD-426.0) incomplete but adequately controlled His updated medication list for this problem includes:    Carvedilol 25 Mg Tabs (Carvedilol) .Marland Kitchen... Take 1 tab two times a day    Enalapril Maleate 10 Mg Tabs (Enalapril maleate) .Marland Kitchen... 1 tab once daily    Aspirin Adult Low Strength 81 Mg Tbec (Aspirin) ..... Once daily

## 2010-06-25 NOTE — Progress Notes (Signed)
Summary: Schedule Colonoscopy  Phone Note Outgoing Call Call back at [facility] Phone [phone]   Call placed by: Harlow Mares CMA Duncan Dull),  October 26, 2009 9:54 AM Call placed to: Patient Summary of Call: Left message on patients machine to call back. patient is due for colonoscopy but will need a office visit to discuss before scheduling.  Initial call taken by: Harlow Mares CMA Duncan Dull),  October 26, 2009 10:03 AM  Follow-up for Phone Call        pt and his wife have decided based on his age and the fact that Dr [name] does Hemocults yearly they do not feel that they want the pt to have a repeat colonoscopy. I have explained that Dr. Russella Dar reviewed the chart and made the decision based on the patients personal history of UC and diverticulosis the pt needs to come in and have an office visit to disccus having a colonoscopy with Dr. Russella Dar, they still declined. I will forward this note to Dr [name]. Follow-up by: Harlow Mares CMA Duncan Dull),  November 05, 2009 2:31 PM

## 2010-06-25 NOTE — Miscellaneous (Signed)
  Clinical Lists Changes  Observations: Added new observation of PAST MED HX: Permanent Atrial Fibrillation NOT candidate coumadin Bradycardia AV junction Ablation with resumption on conduction s/p St Judes Pacemaker-2011 revision/St. Jude Accent SR Hypertension Subdural Hematoma--on coumadin Colitis  (02/25/2010 9:18) Added new observation of PAST SURG HX: Craniotomy for subdural hematoma Breast cystectomy Neck Cystectomy Hand Surgery Pacemaker implant 2003-2010 revision-St Jude Accent SR  (02/25/2010 9:18)      Allergies: 1)  Celebrex (Celecoxib) 2)  * Vioxx (Rofecoxib)   Past History:  Past Medical History: Permanent Atrial Fibrillation NOT candidate coumadin Bradycardia AV junction Ablation with resumption on conduction s/p St Judes Pacemaker-2011 revision/St. Jude Accent SR Hypertension Subdural Hematoma--on coumadin Colitis  Past Surgical History: Craniotomy for subdural hematoma Breast cystectomy Neck Cystectomy Hand Surgery Pacemaker implant 2003-2010 revision-St Jude Accent SR

## 2010-06-27 NOTE — Progress Notes (Signed)
Summary: question re device  Phone Note Call from Patient Call back at Home Phone [phone]   Caller: Spouse Reason for Call: Talk to Nurse Summary of Call: question re device transmission Initial call taken by: Roe Coombs,  June 04, 2010 9:48 AM  Follow-up for Phone Call        pt just wanted to make sure transmission was received. Vella Kohler  June 04, 2010 10:02 AM

## 2010-07-07 ENCOUNTER — Encounter (INDEPENDENT_AMBULATORY_CARE_PROVIDER_SITE_OTHER): Payer: Self-pay | Admitting: *Deleted

## 2010-07-17 NOTE — Letter (Signed)
Summary: Remote Device Check  [facility] [employer], Main Office  [address]   [place], [place] [address]   Phone: [phone]  Fax: [phone]     July 07, 2010 MRN: [id number]   RORAN WEGNER [address] [place], [place]  [address]   Dear Mr. Dewaine Conger,   Your remote transmission was recieved and reviewed by your physician.  All diagnostics were within normal limits for you.  __X___Your next transmission is scheduled for: 08-29-2010.   Please transmit at any time this day.  If you have a wireless device your transmission will be sent automatically.   Sincerely,  Vella Kohler

## 2010-07-17 NOTE — Cardiovascular Report (Signed)
Summary: Office Visit Remote   Office Visit Remote   Imported By: Roderic Ovens 07/09/2010 15:29:23  _____________________________________________________________________  External Attachment:    Type:   Image     Comment:   External Document

## 2010-07-25 ENCOUNTER — Encounter: Payer: Self-pay | Admitting: Internal Medicine

## 2010-07-30 ENCOUNTER — Encounter: Payer: Self-pay | Admitting: Internal Medicine

## 2010-08-01 ENCOUNTER — Encounter: Payer: Self-pay | Admitting: Physician Assistant

## 2010-08-01 ENCOUNTER — Ambulatory Visit (INDEPENDENT_AMBULATORY_CARE_PROVIDER_SITE_OTHER): Payer: Medicare Other | Admitting: Physician Assistant

## 2010-08-01 ENCOUNTER — Encounter: Payer: Self-pay | Admitting: Internal Medicine

## 2010-08-01 ENCOUNTER — Encounter (INDEPENDENT_AMBULATORY_CARE_PROVIDER_SITE_OTHER): Payer: Medicare Other

## 2010-08-01 DIAGNOSIS — I5033 Acute on chronic diastolic (congestive) heart failure: Secondary | ICD-10-CM

## 2010-08-01 DIAGNOSIS — R5381 Other malaise: Secondary | ICD-10-CM | POA: Insufficient documentation

## 2010-08-01 DIAGNOSIS — I080 Rheumatic disorders of both mitral and aortic valves: Secondary | ICD-10-CM | POA: Insufficient documentation

## 2010-08-01 DIAGNOSIS — I4891 Unspecified atrial fibrillation: Secondary | ICD-10-CM

## 2010-08-01 DIAGNOSIS — R5383 Other fatigue: Secondary | ICD-10-CM

## 2010-08-01 DIAGNOSIS — R609 Edema, unspecified: Secondary | ICD-10-CM

## 2010-08-06 NOTE — Assessment & Plan Note (Addendum)
Summary: edema   Visit Type:  Follow-up  CC:  Swelling in legs and stomach.  History of Present Illness: Primary Electrophysiologist:  Dr. Sherryl Manges  John [name] is an 77 yo male with a h/o perm. AFib, not on coumadin 2/2 h/o SDH, bradycardia s/p pacer, h/o AVN ablation with resumption of antegrade conduction, NICM with prior EF 30% improved to 50-55%, diast. CHF, chronic LE edema and HTN.  Last cath 01/2008 demonstrated luminal irregs.  Echo in 04/2009: EF 50-55%, mod LVH, mild AI, mild MVP of post leaflet with mod MR, severe BAE and PASP 45.  He presents with a 10 pound weight gain over the last 4-6 weeks.  He is fairly sedentary.  He probably has chronic NYHA class III dyspnea.  There has been no significant change.  He denies orthopnea or PND.  His lower extremity edema has worsened.  He has a fullness in his abdomen.  His PCP recently did an abdominal ultrasound and lower extremity venous Dopplers.  He also had some lab work obtained.  I do not have a copy of these results.  Apparently his heart rate was high when he was in the office last week with his PCP.  His digoxin was increased to twice a day.  He had a fused beat show up on his EKG today.  I had his device interrogated.  He does have variable heart rates ranging anywhere from 70-110s.  His heart rates do get up into the 120-140 range.  However, this is a minimal amount of the time.  His auto capture was turned off today due to frequent fused beats.  Current Medications (verified): 1)  Digoxin 0.125 Mg Tabs (Digoxin) .Marland Kitchen.. 1 Tablet Two Times A Day 2)  Carvedilol 25 Mg Tabs (Carvedilol) .... Take 1 Tab Two Times A Day 3)  Enalapril Maleate 10 Mg Tabs (Enalapril Maleate) .Marland Kitchen.. 1 Tab Once Daily 4)  Aspirin Adult Low Strength 81 Mg Tbec (Aspirin) .... Once Daily 5)  Vitamin D 81191 Unit Caps (Ergocalciferol) .... Take 1 Cap 1 Day Per Week, Sundays 6)  Hydrocodone-Acetaminophen 5-325 Mg Tabs (Hydrocodone-Acetaminophen) .... Take 1/2  Tab Two Times A Day As Needed 7)  Lexapro 10 Mg Tabs (Escitalopram Oxalate) .... Take One Tablet Once Daily 8)  Multi Vit .... Once Daily 9)  Furosemide 40 Mg Tabs (Furosemide) .... Take 1 Tablet Two Times A Day 10)  Potassium Chloride Cr 10 Meq Cr-Tabs (Potassium Chloride) .... Take One Tablet Two Times A Day 11)  Voltaren 1 % Gel (Diclofenac Sodium) .... As Needed 12)  Fish Oil 1000 Mg Caps (Omega-3 Fatty Acids) .... Take 1 Capsule By Mouth Once A Day 13)  B-Plex Plus  Tabs (Multiple Vitamins-Minerals) .... Two Times A Day  Allergies (verified): 1)  ! Coumadin 2)  Celebrex (Celecoxib) 3)  * Vioxx (Rofecoxib)  Past History:  Past Medical History: Last updated: 02/25/2010 Permanent Atrial Fibrillation NOT candidate coumadin Bradycardia AV junction Ablation with resumption on conduction s/p St Judes Pacemaker-2011 revision/St. Jude Accent SR Hypertension Subdural Hematoma--on coumadin Colitis  Social History: Reviewed history from 03/23/2008 and no changes required. Married  Tobacco Use - No.  Alcohol Use - no Children  Review of Systems       His wife describes apneic episodes while sleeping, snoring and daytime hypersomnolence.  He denies fevers, chills, melena, hematochezia, cough, diarrhea.  Otherwise, as per  the HPI.  All other systems reviewed and negative.   Vital Signs:  Patient profile:  77 year old male Height:      71 inches Weight:      194.50 pounds BMI:     27.23 Pulse rate:   81 / minute BP sitting:   129 / 76  (left arm) Cuff size:   regular  Vitals Entered By: Caralee Ates CMA (August 01, 2010 11:49 AM)  Physical Exam  General:  Elderly, chronically ill appearing male in no acute distress HEENT: normal Neck: JVP slightly increased at 90 degrees Cardiac:  irreg. irreg.; 2/6 holosystolic murmur along the LSB/apex Lungs:  clear to auscultation bilaterally, no wheezing, rhonchi or rales Abd: soft, nontender, no hepatomegaly Ext: 1-2+ tightl  bilateral edema Skin: warm and dry Neuro:  CNs 2-12 intact, no focal abnormalities noted    PPM Specifications Following MD:  Sherryl Manges, MD     PPM Vendor:  St Jude     PPM Model Number:  [id number]     PPM Serial Number:  [id number] PPM DOI:  05/10/2009     PPM Implanting MD:  Sherryl Manges, MD  Lead 1    Location: [facility]     DOI: 05/18/2002     Model #: [id number]     Serial #: [id number]     Status: active Lead 2    Location: [facility]     DOI: 05/18/2002     Model #: [id number]     Serial #: [id number]     Status: active  Magnet Response Rate:  BOL 98.6 ERI 86.3  Indications:  A-fib with AV node ablation  Explantation Comments:  05/10/2009 St. Jude [id number] explanted.  PPM Follow Up Pacer Dependent:  No      Episodes Coumadin:  No  Parameters Mode:  VVIR     Lower Rate Limit:  70     Upper Rate Limit:  120  Impression & Recommendations:  Problem # 1:  ACUTE ON CHRONIC DIASTOLIC HEART FAILURE (ICD-428.33) He has some evidence of volume overload.  His weight is up 11 pounds by our scales.  He will increase his Lasix to 60 mg twice a day.  He will increase his potassium to 20 mEq in the morning and 10 mEq in the afternoon.  We will have him come back in one week for a basic metabolic panel and BNP.  I will try to obtain the lab results and studies recently done by his PCP.  He will followup with [name] in the next 2 weeks.  His diet sounds suspect.  I have educated him and his wife today on limiting his salt as well as his fluid intake to help control volume overload.  Problem # 2:  ATRIAL FIBRILLATION (ICD-427.31) He has had some variable heart rates.  As noted above, his pacemaker settings were changed to turn off his auto capture.  Question whether or not his elevated heart rates are in response to his volume overload or causing it.  His digoxin was recently increased.  As noted above, we will try to obtain the results of his recent lab work from his PCP.  If a digoxin level was not obtained since  March 1, we will need to have that done at his lab visit next week.  He can have his device interrogated again in 2 weeks when he sees [name].  If his heart rate remains significantly variable, we could consider adding low-dose metoprolol to his medical regimen to help control his heart rate.  Problem # 3:  MITRAL REGURGITATION (ICD-396.3)  This is moderate by last echocardiogram done December 2010.  Problem # 4:  FATIGUE (ICD-780.79) He is describing symptoms that are fairly consistent with sleep apnea.  We will need to consider whether or not to refer him to sleep medicine for evaluation when he returns.  Patient Instructions: 1)  Your physician recommends that you schedule a follow-up appointment in: 2 weeks with [name] 4/03 /12 AT 4:15 pm 2)  Your physician recommends that you return for lab work in:in one week BMET and BNP.08/08/10 /2012 3)  Your physician has recommended you make the following change in your medication: Increase Lasix 40 mg 1 and 1/2 tablet twice a day and POTASSIUM 20 MEG ONE TABLET IN AM 1/2 TABLET IN pm. Prescriptions: POTASSIUM CHLORIDE CRYS CR 20 MEQ CR-TABS (POTASSIUM CHLORIDE CRYS CR) Take one tablet by mouth  in AM and 1/2 tablet in PM  #45 x 6   Entered by:   Ollen Gross, RN, BSN   Authorized by:   Tereso Newcomer PA-C   Signed by:   Ollen Gross, RN, BSN on 08/01/2010   Method used:   Electronically to        [facility] [address] [id number] (retail)       [address]       [place], [place]  [address]       Ph: [phone]       Fax: [phone]   RxID:   [id number] FUROSEMIDE 40 MG TABS (FUROSEMIDE) Take 1 and 1/2 tablet two times a day  #90 x 6   Entered by:   Ollen Gross, RN, BSN   Authorized by:   Tereso Newcomer PA-C   Signed by:   Ollen Gross, RN, BSN on 08/01/2010   Method used:   Electronically to        [id number] (retail)       [address]       [place], [place]  [address]       Ph: [phone]       Fax: [phone]   RxID:   [id number]  I have personally reviewed the prescriptions today for accuracy.Tereso Newcomer PA-C  August 01, 2010 1:23 PM  Appended Document: edema    Clinical Lists Changes  Observations: Added new observation of EKG INTERP: atrial fibrillation Ventricular rate 81 Intermittent pacing T wave inversions in 2, 3, aVF and V4, V5 There is a pacer spike following the QRS in one beat (08/01/2010 13:33)       EKG  Procedure date:  08/01/2010  Findings:      atrial fibrillation Ventricular rate 81 Intermittent pacing T wave inversions in 2, 3, aVF and V4, V5 There is a pacer spike following the QRS in one beat

## 2010-08-08 ENCOUNTER — Encounter: Payer: Self-pay | Admitting: Internal Medicine

## 2010-08-08 ENCOUNTER — Other Ambulatory Visit: Payer: Self-pay

## 2010-08-08 ENCOUNTER — Other Ambulatory Visit (INDEPENDENT_AMBULATORY_CARE_PROVIDER_SITE_OTHER): Payer: Medicare Other

## 2010-08-08 DIAGNOSIS — R0609 Other forms of dyspnea: Secondary | ICD-10-CM

## 2010-08-08 DIAGNOSIS — R609 Edema, unspecified: Secondary | ICD-10-CM

## 2010-08-08 LAB — BASIC METABOLIC PANEL
BUN: 20 mg/dL (ref 6–23)
CO2: 33 mEq/L — ABNORMAL HIGH (ref 19–32)
Sodium: 138 mEq/L (ref 135–145)

## 2010-08-08 LAB — BRAIN NATRIURETIC PEPTIDE: Pro B Natriuretic peptide (BNP): 796.3 pg/mL — ABNORMAL HIGH (ref 0.0–100.0)

## 2010-08-13 NOTE — Procedures (Signed)
Summary: pacer check   Current Medications (verified): 1)  Digoxin 0.125 Mg Tabs (Digoxin) .Marland Kitchen.. 1 Tablet Two Times A Day 2)  Carvedilol 25 Mg Tabs (Carvedilol) .... Take 1 Tab Two Times A Day 3)  Enalapril Maleate 10 Mg Tabs (Enalapril Maleate) .Marland Kitchen.. 1 Tab Once Daily 4)  Aspirin Adult Low Strength 81 Mg Tbec (Aspirin) .... Once Daily 5)  Vitamin D [address] Unit Caps (Ergocalciferol) .... Take 1 Cap 1 Day Per Week, Sundays 6)  Hydrocodone-Acetaminophen 5-325 Mg Tabs (Hydrocodone-Acetaminophen) .... Take 1/2 Tab Two Times A Day As Needed 7)  Lexapro 10 Mg Tabs (Escitalopram Oxalate) .... Take One Tablet Once Daily 8)  Multi Vit .... Once Daily 9)  Furosemide 40 Mg Tabs (Furosemide) .... Take 1 and 1/2 Tablet Two Times A Day 10)  Potassium Chloride Crys Cr 20 Meq Cr-Tabs (Potassium Chloride Crys Cr) .... Take One Tablet By Mouth  in Am and 1/2 Tablet in Pm 11)  Voltaren 1 % Gel (Diclofenac Sodium) .... As Needed 12)  Fish Oil 1000 Mg Caps (Omega-3 Fatty Acids) .... Take 1 Capsule By Mouth Once A Day 13)  B-Plex Plus  Tabs (Multiple Vitamins-Minerals) .... Two Times A Day  Allergies (verified): 1)  ! Coumadin 2)  Celebrex (Celecoxib) 3)  * Vioxx (Rofecoxib)  PPM Specifications Following MD:  Sherryl Manges, MD     PPM Vendor:  St Jude     PPM Model Number:  [id number]     PPM Serial Number:  [id number] PPM DOI:  05/10/2009     PPM Implanting MD:  Sherryl Manges, MD  Lead 1    Location: RA     DOI: 05/18/2002     Model #: [id number]     Serial #: [id number]     Status: active Lead 2    Location: RV     DOI: 05/18/2002     Model #: [id number]     Serial #: [id number]     Status: active  Magnet Response Rate:  BOL 98.6 ERI 86.3  Indications:  A-fib with AV node ablation  Explantation Comments:  05/10/2009 St. Jude [id number] explanted.  PPM Follow Up Pacer Dependent:  No      Episodes Coumadin:  No  Parameters Mode:  VVIR     Lower Rate Limit:  70     Upper Rate Limit:  120 Tech Comments:  see paceart  report.

## 2010-08-13 NOTE — Cardiovascular Report (Signed)
Summary: Office Visit   Office Visit   Imported By: Roderic Ovens 08/09/2010 16:08:44  _____________________________________________________________________  External Attachment:    Type:   Image     Comment:   External Document

## 2010-08-13 NOTE — Letter (Signed)
Summary: [facility]   Imported By: Marylou Mccoy 08/02/2010 13:16:01  _____________________________________________________________________  External Attachment:    Type:   Image     Comment:   External Document

## 2010-08-15 ENCOUNTER — Encounter: Payer: Self-pay | Admitting: Internal Medicine

## 2010-08-27 ENCOUNTER — Ambulatory Visit (INDEPENDENT_AMBULATORY_CARE_PROVIDER_SITE_OTHER): Payer: Medicare Other | Admitting: Internal Medicine

## 2010-08-27 ENCOUNTER — Encounter: Payer: Self-pay | Admitting: Internal Medicine

## 2010-08-27 ENCOUNTER — Other Ambulatory Visit (INDEPENDENT_AMBULATORY_CARE_PROVIDER_SITE_OTHER): Payer: Medicare Other | Admitting: *Deleted

## 2010-08-27 VITALS — BP 138/82 | HR 88 | Ht 70.0 in | Wt 183.0 lb

## 2010-08-27 DIAGNOSIS — Z950 Presence of cardiac pacemaker: Secondary | ICD-10-CM

## 2010-08-27 DIAGNOSIS — R0989 Other specified symptoms and signs involving the circulatory and respiratory systems: Secondary | ICD-10-CM

## 2010-08-27 DIAGNOSIS — I442 Atrioventricular block, complete: Secondary | ICD-10-CM

## 2010-08-27 DIAGNOSIS — I4891 Unspecified atrial fibrillation: Secondary | ICD-10-CM

## 2010-08-27 DIAGNOSIS — Z79899 Other long term (current) drug therapy: Secondary | ICD-10-CM

## 2010-08-27 DIAGNOSIS — I428 Other cardiomyopathies: Secondary | ICD-10-CM

## 2010-08-27 NOTE — Patient Instructions (Signed)
Your physician recommends that you schedule a follow-up appointment in: 6 MONTHS WITH DR KLEIN Your physician recommends that you continue on your current medications as directed. Please refer to the Current Medication list given to you today. 

## 2010-08-27 NOTE — Assessment & Plan Note (Signed)
Largely resolved with the last ejection fraction of 50%. Will plan at this point to discontinue his digoxin

## 2010-08-27 NOTE — Assessment & Plan Note (Signed)
Permanent atrial fibrillation; he does not take Coumadin secondary to subdural hematomata

## 2010-08-27 NOTE — Assessment & Plan Note (Signed)
The patient's device was interrogated.  The information was reviewed. No changes were made in the programming.    

## 2010-08-27 NOTE — Assessment & Plan Note (Signed)
Intermittent antegrade conduction

## 2010-08-27 NOTE — Progress Notes (Signed)
HPI  John [name] is a 77 y.o. male Seen in followup for atrial fibrillation is permanent status post AV junction ablation with resumption of antegrade conduction. He status post pacemaker implantation. There has been significant interval improvement in his nonischemic myopathy going from 30%-55%.  His history of subdural hematoma; has he does not take Coumadin   Last cath 01/2008 demonstrated luminal irregs.  Echo in 04/2009: EF 50-55%, mod LVH, mild AI, mild MVP of post leaflet with mod MR, severe BAE and PASP 45.   Past Medical History  Diagnosis Date  . Permanent atrial fibrillation     NOT CANDIDATE FOR COUMADIN  . Bradycardia     AV JUNCTION ABLATION W/RESUMPTION ON CONDUCTION S/P StJUDE PACEMAKER-2011 REVISION/St.JUDE ACCENT SR  . Hypertension   . Subdural hematoma     WHILE ON COUMADIN  . Colitis     Past Surgical History  Procedure Date  . Subdural hematoma evacuation via craniotomy   . Cystectomy     BREAST, NECK  . Hand surgery   . Insert / replace / remove pacemaker     PACEMAKER IMPLANT 2003-2010.Marland KitchenMarland KitchenREVISION St, JUDE ACCENT SR    Current Outpatient Prescriptions  Medication Sig Dispense Refill  . aspirin 81 MG EC tablet Take 81 mg by mouth daily.        Marland Kitchen CALCIUM-VITAMIN D PO Take by mouth daily. Calcium 1200, vit d 1000       . carvedilol (COREG) 25 MG tablet Take 25 mg by mouth 2 (two) times daily with a meal.        . diclofenac sodium (VOLTAREN) 1 % GEL Apply 1 application topically as needed.        . digoxin (LANOXIN) 0.125 MG tablet Take 125 mcg by mouth daily.        . enalapril (VASOTEC) 10 MG tablet Take 10 mg by mouth daily.        . ergocalciferol (VITAMIN D2) 50000 UNITS capsule Take 50,000 Units by mouth once a week.        . escitalopram (LEXAPRO) 10 MG tablet Take 10 mg by mouth daily.        . fish oil-omega-3 fatty acids 1000 MG capsule Take 1 g by mouth daily.        . furosemide (LASIX) 40 MG tablet Take 40 mg by mouth 2 (two) times daily.  TAKE 1 AND 1/2 TABS BID       . HYDROcodone-acetaminophen (NORCO) 5-325 MG per tablet Take 1 tablet by mouth 2 (two) times daily as needed. TAKE 1/2 TAB BID PRN       . Multiple Vitamin (MULTIVITAMIN) tablet Take 1 tablet by mouth daily.        . Multiple Vitamins-Minerals (B-PLEX PLUS) TABS Take 1 tablet by mouth 2 (two) times daily.        . potassium chloride SA (K-DUR,KLOR-CON) 20 MEQ tablet Take 20 mEq by mouth 2 (two) times daily. TAKE 1 TAB IN THE AM AND 1/2 TAB IN THE PM         Allergies  Allergen Reactions  . Celecoxib     REACTION: unspecified  . Warfarin Sodium     Review of Systems negative except from HPI and PMH  Physical Exam Well developed and well nourished in no acute distress HENT normal E scleral and icterus clear Neck Supple JVP 7-8 cm; carotids brisk and full Clear to ausculation Regular rate and rhythm, Harsh 3/6 systolic murmur radiating to the apex  Soft with active bowel sounds No clubbing cyanosisMedical 2+ brawny edema; he is wearing support stockings Alert and oriented, grossly normal motor and sensory function Skin Warm and Dry  ECG Atrial fibrillation with intermittent ventricular paced seen and intrinsic conduction the rate of 88  Assessment and  Plan

## 2010-08-28 ENCOUNTER — Telehealth: Payer: Self-pay | Admitting: *Deleted

## 2010-08-28 ENCOUNTER — Telehealth: Payer: Self-pay | Admitting: Internal Medicine

## 2010-08-28 LAB — BASIC METABOLIC PANEL
BUN: 20 mg/dL (ref 6–23)
Calcium: 8.7 mg/dL (ref 8.4–10.5)
Creatinine, Ser: 1 mg/dL (ref 0.4–1.5)
Glucose, Bld: 83 mg/dL (ref 70–99)
Potassium: 3.9 mEq/L (ref 3.5–5.1)
Sodium: 140 mEq/L (ref 135–145)

## 2010-08-28 LAB — DIGOXIN LEVEL: Digoxin Level: 1.5 ng/mL (ref 0.8–2.0)

## 2010-08-28 NOTE — Telephone Encounter (Signed)
Pt s wife is right   We will stop dig and increase lasix 2 fold

## 2010-08-28 NOTE — Telephone Encounter (Signed)
Called patient's wife back and advised per Dr.Klein that she is correct. He will stop the dig and increase the lasix.

## 2010-08-28 NOTE — Telephone Encounter (Signed)
Patient's wife called today and questioned her husband's medication list from the visit yesterday. On the note from yesterday, the after visit summary mentions NO medication changes. Yet the patient's wife states that Dr.Klein advise them to stop Dig and increase Lasix to  40mg   2 tabs 2 times per day. Advised her that I will speak with Dr.Klein to clarify this afternoon and call her back.

## 2010-08-28 NOTE — Telephone Encounter (Signed)
Pt wife has question re meds

## 2010-08-29 ENCOUNTER — Encounter: Payer: Self-pay | Admitting: *Deleted

## 2010-09-23 ENCOUNTER — Telehealth: Payer: Self-pay | Admitting: Internal Medicine

## 2010-09-23 MED ORDER — FUROSEMIDE 40 MG PO TABS: 40.0000 mg | ORAL_TABLET | Freq: Two times a day (BID) | ORAL | Status: DC

## 2010-09-23 MED ORDER — POTASSIUM CHLORIDE CRYS ER 20 MEQ PO TBCR: 20.0000 meq | EXTENDED_RELEASE_TABLET | Freq: Two times a day (BID) | ORAL | Status: DC

## 2010-09-23 NOTE — Telephone Encounter (Signed)
furosimide and potassium was increased, then increased again pt needs to verify dosage due this pharmacy still having the old info-then needs a refill sent to [facility] [phone]

## 2010-09-23 NOTE — Telephone Encounter (Signed)
Patient's wife had questions regarding her husband's medications. Went over instructions. Will route to [name] to make sure he did want patient to take 40 mg of Lasix two pills twice daily.

## 2010-09-23 NOTE — Telephone Encounter (Signed)
Pt wife has question re meds. Pt wife states meds were increased and pt was not aware of it. Pt wife wants to talk to a nurse

## 2010-09-23 NOTE — Telephone Encounter (Signed)
Lasix and Potassium refilled.. I will see if [name] wants to increase Potassium to daily since Lasix was increased to 40 mg two tabs. twice daily. Current dose of Potassium 20 meq one tab in the am and 1/2 tab in the pm.

## 2010-09-24 ENCOUNTER — Telehealth: Payer: Self-pay | Admitting: Internal Medicine

## 2010-09-24 NOTE — Telephone Encounter (Signed)
Rx was sent yesterday e-scribe on K+ and furosemide and they need directions clarified

## 2010-09-25 NOTE — Telephone Encounter (Signed)
Pharmacist needed clarification on John [name] dosage of Lasix. They are aware of the new dose.

## 2010-09-25 NOTE — Telephone Encounter (Signed)
DUPLICATE PHONE NOTE ./CY 

## 2010-09-25 NOTE — Telephone Encounter (Signed)
We should re check BMET thanks

## 2010-09-26 NOTE — Telephone Encounter (Signed)
SEE OTHER PHONE NOTE./CY 

## 2010-09-26 NOTE — Telephone Encounter (Signed)
PT'S WIFE AWARE  WILL COME IN ON 5/4 /12 FOR REPEAT BMET .Zack Seal

## 2010-09-27 ENCOUNTER — Telehealth: Payer: Self-pay | Admitting: *Deleted

## 2010-09-27 ENCOUNTER — Other Ambulatory Visit (INDEPENDENT_AMBULATORY_CARE_PROVIDER_SITE_OTHER): Payer: Medicare Other | Admitting: *Deleted

## 2010-09-27 DIAGNOSIS — Z7901 Long term (current) use of anticoagulants: Secondary | ICD-10-CM

## 2010-09-27 DIAGNOSIS — Z79899 Other long term (current) drug therapy: Secondary | ICD-10-CM

## 2010-09-27 LAB — BASIC METABOLIC PANEL
BUN: 18 mg/dL (ref 6–23)
CO2: 30 mEq/L (ref 19–32)
Calcium: 8.5 mg/dL (ref 8.4–10.5)
Chloride: 100 mEq/L (ref 96–112)
Creat: 0.99 mg/dL (ref 0.40–1.50)
Glucose, Bld: 83 mg/dL (ref 70–99)
Potassium: 3.9 mEq/L (ref 3.5–5.3)
Sodium: 139 mEq/L (ref 135–145)

## 2010-09-27 MED ORDER — POTASSIUM CHLORIDE CRYS ER 20 MEQ PO TBCR: EXTENDED_RELEASE_TABLET | ORAL | Status: DC

## 2010-09-27 NOTE — Telephone Encounter (Signed)
Pt's wife calling back for explanation about meds(potassium)-advised pt's wife i figured out problem--lasix changed but potassium not changed in e-scribe to pharmacy--i will notify nurse who sent in RX to correct at pharmacy and call you when this is done, so you may go get your extra potassium--pt's wife agrees--nt

## 2010-10-01 ENCOUNTER — Telehealth: Payer: Self-pay | Admitting: Cardiology

## 2010-10-01 MED ORDER — POTASSIUM CHLORIDE CRYS ER 20 MEQ PO TBCR: EXTENDED_RELEASE_TABLET | ORAL | Status: DC

## 2010-10-01 NOTE — Telephone Encounter (Signed)
Potassium refilled for a dose of 40 meq daily. Patient's wife is aware.

## 2010-10-08 NOTE — Letter (Signed)
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  I am pleased to tell  you that they gave up a 9-run lead to the Orioles the other night.  John [name] comes in today with his wife.  They are doing fairly well.  He is  trying to get some exercise over at the [facility] and as you know the  surgeons have turned him down for any knee surgery.   His major complaint is edema.  Apparently, he has some issues about  renal insufficiency that has precluded more aggressive diuretic therapy.   His medications include Cartia 300, which may be contributing to his  edema, triamterene/hydrochlorothiazide 37.5/25.5, Lanoxin 0.125 one and  a half pills a day, and metoprolol 100 b.i.d.   On examination, his blood pressure was 107/68, his pulse was 77.  His  lungs were clear.  His heart sounds were regular.  The abdomen was  protuberant but soft.  The extremities had 2 to 3+ peripheral edema with  a little bit of erythema on the left side.   Interrogation of his device demonstrates a St. Jude Identity with an R-  wave of 10, impedance of 440, threshold 0.65 at 0.5, battery voltage  2.73, and the intrinsic escape at 38 beats per minute.  He is  ventricularly paced 84% of the time.  Heart rate excursion is really  quite well controlled.  His ventricular sensed events are typically  quite slow.   IMPRESSION:  1. Atrial fibrillation status post ablation with some intrinsic      conduction.  2. Significant peripheral edema, question related to [place].  3. Question left ventricular function.  4. Lanoxin therapy for question indication.   John [name], I had a couple of thoughts today on John [name].  The first was to  reassess his left ventricular function.  In the event that it is not  terrible, I would think about discontinuing his Lanoxin altogether,  given the recent data suggesting untoward effect in people who do not  need it for heart failure.   I also wondered whether his Nancie Neas is contributing to his edema and I  was wondering whether there was some alternative therapy that could be  pursued for it.  I suspect it was started initially for rate control,  but with his AV junction ablation, we might be able to get away with  none of it or something else instead.   We will plan to see him again in 6 months' time.   We will see him in 6 months' time.  I have asked that he follow up with  you about that.  We will get the echo and get that forwarded to you.    Sincerely,      [name], MD, [facility]  Electronically Signed    SCK/MedQ  DD: 11/25/2007  DT: 11/26/2007  Job #: [id number]

## 2010-10-08 NOTE — Assessment & Plan Note (Signed)
[facility]                         ELECTROPHYSIOLOGY OFFICE NOTE   DEE, MADAY                      MRN:          [id number]  DATE:07/10/2008                            DOB:          08/07/1923    [name] is seen in followup for pacemaker implanted for bradycardia  in the setting of atrial fibrillation.  He has status post AV junction  ablation with resumption of antegrade conduction.  He has complaints of  some modest dyspnea with exertion.   His medications are notable for carvedilol 12.5 b.i.d., enalapril 10,  triamterene and hydrochlorothiazide, Lanoxin 0.125, aspirin 81.  He has  not tolerated Coumadin.   On examination, his blood pressure is 113/66 with a pulse of 91.  His  weight was 190 which is relatively stable.  His lungs were clear.  Heart  sounds were irregular.  The abdomen was soft.  The extremities had trace  edema.   Interrogation of his pacemaker demonstrated about 2-1/2% of his beats  were faster than 110 which translates to about 40 minutes a day.   IMPRESSION:  1. Atrial fibrillation - permanent with a somewhat rapid ventricular      response.  2. History of bradycardia, status post pacemaker implantation.  3. Left ventricular dysfunction with ejection fraction of 30% by echo      in July.  4. Status post atrioventricular ablation with resumption of      conduction.  5. Congestive heart failure - combined systolic and diastolic - class      II.   Mr. Laneve dyspnea is a problem.  I suspect it may be partly related to  rapid ventricular rates and we will try to augment his rate control by  pushing up his Coreg which might also further improve benefits with  carvedilol, although that apparently is not controversial.   We will plan to reassess his heart rates in about 6 weeks' time.  He  will increase his carvedilol to 18.75 for 3 weeks and then to 25 twice a  day and we will see him in 6 weeks to see what  impact we had on heart  rate control and sinus.     [name], MD, [facility]  Electronically Signed    SCK/MedQ  DD: 07/10/2008  DT: 07/11/2008  Job #: [id number]

## 2010-10-08 NOTE — Letter (Signed)
February 09, 2008    [name], MD  [address]  [place], [place] [address]   RE:  John, [name]  MRN:  [id number]  /  DOB:  26-Apr-1924   Dear John [name],   It was a pleasure talking to you today about John [name] whose situation  continues to be progressively complex.   As you know, he was noted to have intercurrent deterioration of LV  systolic function and he underwent catheterization, which demonstrated  more severe mitral regurgitation that have been appreciated by echo at  3+ versus 1+ and the ejection fraction of 30%.  This occurs in the  context of his known atrial fibrillation and backup brady pacing, where  he is paced about 85% of the time.   With that finding enalapril was initiated 5 mg.  He is also on Lanoxin.  His other medications include metoprolol 100 b.i.d., which we will  switch over to carvedilol as well as on Cymbalta.   He also describes a history of recurrent falling over the last 2 years,  where he has ended up on his head.  He had seen John [name] with [facility]  mentioned, no specific cause was found.   The other issue on the table is what to do about his knee, which is  quite limiting to his quality of life.   PHYSICAL EXAMINATION:  VITAL SIGNS:  His blood pressure is 153/90.  His  pulse is 72.  NECK:  Veins were 9-10 cm.  LUNGS:  Clear.  HEART:  Sounds were regular with no audible murmur.  ABDOMEN:  Soft.  EXTREMITIES:  A 1+ peripheral edema.   Interrogation of his pacemaker demonstrates that he is 72% ventricularly  paced.  Intrinsic beats have a QRS morphology that is narrow.  His R-  wave was 8 and his impedance was 436 with a threshold of 0.62 side at  0.5.   IMPRESSION:  1. Cardiomyopathy - Progressive question mechanism.  2. Mitral regurgitation - Variable with an echo showing 1+ and cath      showing 3+.  3. Atrial fibrillation - Permanent.  4. Status post atrioventricular junction ablation with some recurrence      of intrinsic  conduction.  5. Potential need for knee surgery.   [name], [name] has his cardiomyopathic process, which is progressive  in concerning and stated mitral regurgitation almost alarming.  Medical  therapy will include ACE inhibitors and beta-blockers.  One of the  thought that has occurred to me since I spoke here in the phone earlier  this afternoon is whether resynchronization pacing might help improve  ventricular synchrony as one of the comments in John [name]'s  report that struck me was that there was marked synergy of the left  ventricle.  This might also mitigate some of the mitral regurgitation.   We will increase his enalapril and change him over to Coreg.  Recheck  his blood work today.  I will see him again in 3 weeks and at that  point, however, I had a chance to think a little bit further and talk  with John [name] a little bit further about the possibility of  how this synergy may be contributing to his MR and his LV dysfunction  and whether we cannot improve his situation by resynchronization pacing  and potentially that might go long way to reducing risks associated with  his desire  for knee surgery.     Sincerely,  [name], MD, [facility]  Electronically Signed    SCK/MedQ  DD: 02/09/2008  DT: 02/10/2008  Job #: [id number]

## 2010-10-08 NOTE — Cardiovascular Report (Signed)
NAME:  John [name], John [name] NO.:  [id number]   MEDICAL RECORD NO.:  [id number]          PATIENT TYPE:  OIB   LOCATION:  1962                         FACILITY:  [facility]   PHYSICIAN:  Veverly Fells. Excell Seltzer, MD  DATE OF BIRTH:  1923-12-19   DATE OF PROCEDURE:  01/25/2008  DATE OF DISCHARGE:  01/25/2008                            CARDIAC CATHETERIZATION   PROCEDURES:  1. Right heart catheterization.  2. Left heart catheterization.  3. Selective coronary angiography.  4. Left ventricular angiography.   INDICATIONS:  John [name] is an 77 year old gentleman with a history of  atrial fibrillation and permanent pacemaker.  He was recently evaluated  with echocardiogram that demonstrated markedly reduced LV function with  an LVEF of 30% and inferoposterior akinesis.  He also had mild valvular  disease.  He has also complained of some peripheral edema.  He was  referred for cardiac catheterization in the setting of his decreased EF  and edema, I elected to do a right heart cath in addition to his left  heart.   Risks and indications of the procedure were reviewed with the patient  and informed consent was obtained.  The right groin was prepped, draped,  and anesthetized with 1% lidocaine.  Using modified Seldinger technique,  4-French sheath was placed in the right femoral artery and a 6-French  sheath was placed in the right femoral vein.  A end-hole multipurpose  catheter was used for the right heart catheterization.  Pressures were  recorded throughout the right heart chambers.  Oxygen saturations were  drawn from the superior vena cava, pulmonary artery, and central aorta.  Cardiac output was calculated by the Fick method.  Standard 4-French  Judkins catheters were used for coronary angiography and left  ventriculography.  A pullback across the aortic valve was done.  All  catheter exchanges were performed over a guidewire.  There were no  immediate complications.   FINDINGS:   Oxygen saturations; SVC 67%, pulmonary artery 62%, and aorta  95%.   HEMODYNAMICS:  Right atrial pressure mean of 5, right ventricular  pressure 26/6, pulmonary artery pressure 27/11 with a mean of 17,  pulmonary capillary wedge pressure with mean of 11, left ventricular  pressure 153/16, and aortic pressure 155/84 with a mean of 114.  Cardiac  output 3.7 L/min.  Cardiac index 1.8 L/min/m2.   CORONARY ANGIOGRAPHY:  Left mainstem is angiographically normal.  It  bifurcates into the LAD and left circumflex.   LAD:  The LAD is a large-caliber vessel that courses down and reaches  the LV apex.  The LAD supplies 2 diagonal branches, both of which are  moderate size.  There is minimal lumen irregularity throughout the mid  LAD.  There is no significant stenosis present in the LAD or its  diagonal branches.   Left circumflex:  The left circumflex is a moderate-sized vessel.  There  is a small intermediate branch, and a small to moderate first OM branch  and moderate-sized second OM branch.  There is also left posterolateral.  The mid circumflex has mild lumen irregularity with no significant  stenosis.  This is at the origin of an atrial branch.  There is no  significant disease throughout the circumflex system.   Right coronary artery:  Right coronary artery is dominant and supplies a  PDA branch as well as 2 posterolateral branches.  There is a small acute  marginal branch.  There is no significant stenosis throughout the right  coronary artery or its branch vessels.   Left ventriculography:  There is marked dyssynergy of the left  ventricle.  The base of the heart and the apex contracted different  times throughout the cardiac cycle.  It is difficult to estimate the  LVEF, but I would estimate at 35-40%.  There appears to be 3+ mitral  regurgitation.   ASSESSMENT:  1. Minimal nonobstructive coronary artery disease.  2. Normal left ventricular hemodynamics.  3. Left ventricular  dyssynergy with moderately reduced left      ventricular function.  4. Mitral regurgitation.      Veverly Fells. Excell Seltzer, MD  Electronically Signed     MDC/MEDQ  D:  01/25/2008  T:  01/25/2008  Job:  [id number]   cc:   [name], MD, [facility]  John [name], M.D.

## 2010-10-08 NOTE — H&P (Signed)
[name] ADMISSION   John [name], John [name]                      MRN:          [id number]  DATE:01/19/2008                            DOB:          Jan 13, 1924    PRIMARY CARE PHYSICIAN:  [name].   PRIMARY CARDIOLOGIST:  [name], MD, [facility].   ORTHOPEDIST:  Kerrin Champagne, MD.   This is an 77 year old married white male patient who is here today for  a cardiac cath workup.  The patient was recently considering having knee  surgery and therefore underwent a 2-D echo.  He was found to have  inferior and posterior akinesis with abnormal septal motion.  His LV was  moderately dilated and EF was 30%.  LV wall thickness was mildly  increased.  He had calcified aortic valve with mild AI, mild MR and mild  to moderately dilated LA.  RV was mildly dilated and RA was mild to  moderately dilated.   [name] recommends cardiac catheterization because of new LV  dysfunction.  The patient has recently complained of some dyspnea on  exertion and lower extremity edema.  [name] also recommended stopping  his Cardizem and beginning an ACE inhibitor which we will do today.  The  patient denies any chest pain.  He does fall a lot due to being off  balance and was recently in the emergency room in July after falling and  hitting his head.  CT scan showed no acute abnormality.  The patient has  a history of atrial fibrillation and bradycardia status post pacemaker  implantation and is followed by Dr. Berton Mount.   ALLERGIES:  VIOXX, CELEBREX, BEXTRA, COUMADIN.  He developed a subdural  hematoma on Coumadin.   CURRENT MEDICATIONS:  1. Aspirin 81 mg daily.  2. Triamterene/hydrochlorothiazide 37.25/25 mg daily.  3. Will stop his diltiazem today and start Vasotec 5 mg daily.   PAST MEDICAL HISTORY:  Is significant for hypertension, history of  atrial fibrillation status post permanent pacemaker.  He underwent  status post dual chamber pacemaker implantation in 2003.  He has a  history of craniotomy secondary to bilateral subdural hemorrhage while  on Coumadin, history of hand surgery, history of breast surgery  secondary to cyst, arthritis, balance problems.   SOCIAL HISTORY:  He is married.  He is a nonsmoker.   FAMILY HISTORY:  See old records.   PHYSICAL EXAM:  GENERAL:  This is a pleasant 77 year old white male in  no acute distress.  VITAL SIGNS:  Blood pressure 120/78, pulse 72, weight 182.  HEENT:  Head is normocephalic without sign of trauma.  Extraocular  movements are intact.  Pupils are equal and reactive to light and  accommodation.  Nasal mucosa is moist.  Throat is without erythema or  exudate.  NECK:  Without JVD, HJR, bruit or thyroid enlargement.  LUNGS:  Decreased breath sounds but clear anterior/posterior and  lateral.  HEART:  Regular rate and rhythm at 72 beats per minute, normal S1 and S2  with a 2/6 systolic ejection murmur at the  left sternal border.  ABDOMEN:  Soft without organomegaly, masses, lesions or abnormal  tenderness.  EXTREMITIES:  He has +1 edema on the left, trace of edema on the right.  Positive distal pulses bilaterally.  NEUROLOGICAL:  Exam is without focal deficit.   IMPRESSION:  1. LV dysfunction with wall motion abnormality on 2-D echo, rule out      coronary artery disease, ejection fraction 30%.  2. History of atrial fibrillation with bradycardia status post dual      chamber pacemaker in 2003.  3. Status post craniotomy secondary to bilateral subdural hemorrhage      in December 2003.  4. Hypertension.  5. Recurrent falls secondary to being off balance.  6. Osteoarthritis, need for knee surgery.   PLAN:  I have discussed cardiac catheterization in detail with the  patient and his wife including risks and benefits and the patient is  agreeable.  I also spent a lot of time discussing low-sodium diet and  need for 2 grams of sodium a day.   He is to be scheduled for his cath on  January 25, 2008, by Dr. Excell Seltzer.  The patient is agreeable to proceed.  We will check labs Monday including chest x-ray since we are starting an  ACE inhibitor.      [name], PA-C  Electronically Signed      [name], MD, [facility]  Electronically Signed   ML/MedQ  DD: 01/19/2008  DT: 01/19/2008  Job #: [id number]

## 2010-10-08 NOTE — Assessment & Plan Note (Signed)
[facility]                         ELECTROPHYSIOLOGY OFFICE NOTE   John [name]                      MRN:          [id number]  DATE:02/22/2008                            DOB:          17-Jun-1923    [name] was seen in followup for pacemaker implanted for tachy-brady  syndrome.  He is doing quite well at this point without significant  symptoms.   His medications are reviewed, it include carvedilol 12.5, taken once a  day or twice a day, it says the former Lanoxin 0.125, enalapril,  triamterene.   PHYSICAL EXAMINATION:  VITAL SIGNS:  His blood pressure was 132/32 with  a pulse of 18.  LUNGS:  Clear.  HEART:  Sounds were regular.  EXTREMITIES:  Without edema.   Interrogation of a St. Jude pulse generator demonstrates an R-wave of 9,  impedance of 436 with threshold 0.625.  His ventricular paced at 66% at  times.  His Heart rate excursion is adequate.   IMPRESSION:  1. Atrial fibrillation with bradycardia.  2. Status post pacer for the above.  3. Left ventricular dysfunction.      a.     Ejection fraction of 30%.      b.     Wall motion abnormalities.  4. Arteriovenous junction ablation with resumption of antegrade      conduction.   John [name] is doing pretty well on withstanding results of antegrade  conduction.  Plan to see him again in 6 months' time.     John Salvia, MD, [facility]  Electronically Signed    SCK/MedQ  DD: 02/22/2008  DT: 02/23/2008  Job #: [id number]

## 2010-10-11 NOTE — Discharge Summary (Signed)
   NAME:  John, [name]                         ACCOUNT NO.:  [id number]   MEDICAL RECORD NO.:  [id number]                   PATIENT TYPE:  INP   LOCATION:  3711                                 FACILITY:  [facility]   PHYSICIAN:  Doylene Canning. Ladona Ridgel, M.D. [facility]           DATE OF BIRTH:  1924/04/27   DATE OF ADMISSION:  05/12/2002  DATE OF DISCHARGE:  05/23/2002                                 DISCHARGE SUMMARY   PRIMARY DIAGNOSIS:  Atrial fibrillation with rapid ventricular response.   HISTORY OF PRESENT ILLNESS:  A 77 year old male with a history of chronic  atrial fibrillation diagnosed in 2001, was admitted on April 25, 2002 for  a subdural hematoma, transferred to rehab, May 03, 2002.  In rehab with  activity, he had a cardiac consult for increased heart rate.  The patient  was also noted to have orthostatic hypotension.  He was symptomatic with a  heart rate of 128.  The patient was transferred to the floor for evaluation  and treatment.   HOSPITAL COURSE:  He was placed on a Cardizem drip.  The patient was noted  to be in chronic atrial fibrillation, no documentation of sick sinus  syndrome.  He remained orthostatic at times.  He continued on Cardizem for  rate control.  The patient, on May 16, 2002, was complaining of  dizziness and lightheadedness in the morning.  Telemetry revealed no  significant bradyarrhythmia, yet heart rate was 150s to 180s.  He continued  on Cardizem 60 mg t.i.d. and digoxin.  He was to be seen by EP for  evaluation for a pacemaker.  The patient had a consult with  electrophysiology services.  If the patient's rates remained high, he would  need an A-V nodal ablation and a pacemaker for rate control.  The patient  was to be continued on telemetry.  On May 18, 2002, the patient  underwent an A-V nodal ablation with a placement of a St. Jude Integrity  pacemaker; he tolerated the procedure well, had no immediate postop  complications and had  consults with physical therapy and rehab.  On May 21, 2002, the patient's rates were under good control.  He continued to  improve and was eventually transferred back to rehab on May 23, 2002.     Chinita Pester, C.R.N.P. [facility]                 Doylene Canning. Ladona Ridgel, M.D. [facility]    DS/MEDQ  D:  07/18/2002  T:  07/19/2002  Job:  [id number]   cc:   [name], M.D. [facility]   [facility]

## 2010-10-11 NOTE — Op Note (Signed)
NAME:  John [name], John [name]                         ACCOUNT NO.:  [id number]   MEDICAL RECORD NO.:  [id number]                   PATIENT TYPE:  INP   LOCATION:  3112                                 FACILITY:  [facility]   PHYSICIAN:  Clydene Fake, M.D.               DATE OF BIRTH:  Aug 10, 1923   DATE OF PROCEDURE:  04/26/2002  DATE OF DISCHARGE:                                 OPERATIVE REPORT   PREOPERATIVE DIAGNOSIS:  Bilateral subdural hematomas, right much larger  than left with some right to left shift.   POSTOPERATIVE DIAGNOSIS:  Bilateral subdural hematomas, right much larger  than left with some right to left shift.   OPERATION PERFORMED:  1. Right craniotomy for subdural hematoma with placement of epidural drain.  2. Left frontal bur hole drainage of subdural hematoma with subgaleal drain.   SURGEON:  Clydene Fake, M.D.   ASSISTANT:  Coletta Memos, M.D.   ANESTHESIA:  General endotracheal.   ESTIMATED BLOOD LOSS:  150 cc.   BLOOD REPLACED:  None, but 2 units of fresh frozen plasma given during  surgery.   DRAINS:  As above.   COMPLICATIONS:  None.   INDICATIONS FOR PROCEDURE:  The patient is a 77 year old gentleman with  progressive confusion and problems ambulating.  CT shows chronic subdural  hematomas, right one is larger than left with right to left shift.  The  patient has been on Coumadin.  He has been off that.  We have reversed his  coagulopathy with vitamin K and fresh frozen plasma.  Patient brought in for  surgery.   DESCRIPTION OF PROCEDURE:  The patient was brought to the operating room and  general anesthesia was induced.  The patient was prepped and draped in the  usual sterile fashion.  A question mark shaped incision was made in the  right side scalp and taken down to periosteum.  Hemostasis obtained with  Bovie cauterization.  A skull flap was reflected anteriorly and held in  place with fish hooks.  Bur holes were drilled in the right temporal  area  and posterior parietal area dura stripped from the craniotome was used to  elevate a bone flap.  Four holes were drilled in the middle of the bone for  central tack up sutures and the bones left _______  antibiotic solution  until later in the case.  Dural tack up sutures were then placed after  making holes in the edges of the skull where the bone flap was taken and  wound was irrigated with antibiotic solution.  Hemostasis in dura obtained  with bipolar cauterization.  The dura was then opened in the usual fashion  with the pedicle medial and some subdural clot was seen.  This was irrigated  out.  There was an inner membrane that was removed through the extent of the  craniotomy eminence.  The brain was pulsatile.  There was  a significant  amount of subdural space that was irrigated out until irrigation was clear.  There was an outer membrane stuck to the dura which was peeled off.  Then  the dura was closed with 4-0 Nurolon interrupted sutures.  It was not closed  watertight.  We actually made some holes in the center of the dura and  placed and drain, bringing it out through a separate stab wound incision and  placed the drain in the epidural space.  The bone flap was then placed back  in place and held there with the RapidFlap system.  Central dural tack up  sutures were then tied.  Skull flap brought back in place after the wound  was irrigated with antibiotic solution and the temporalis fascia occluded  with 2-0 Vicryl interrupted suture.  The galea was closed with 2-0 Vicryl  interrupted suture.  We closed the skin with staples.  The drain was sutured  in with 3-0 nylon suture and we connected the drain to bulb suction.  Attention was then taken to the left side where a 3 cm incision was made in  the left side just behind the hair line.  Hemostasis was obtained with Bovie  cauterization.  A high speed drill was used to perform a bur hole.  The dura  was coagulated open and a  central clot was removed.  The remainder of  membrane came up through the dura.  That was incised and _____  removed.  We  then irrigated the subdural space clear.  Through a separate stab wound,  another Jackson-Pratt drain was placed and this drain was kept in the  subgaleal space over the bur hole.  The galea was closed with 3-0 Vicryl  interrupted sutures.  Skin closed with staples, drain sutured in with 3-0  nylon with drain connected to JP bulb suction.  Telfa was placed over the  incisions.  The head was wrapped.  The patient was then awakened from  anesthesia and transferred to the recovery room in stable and fair  condition.                                                 Clydene Fake, M.D.    JRH/MEDQ  D:  04/26/2002  T:  04/26/2002  Job:  [id number]

## 2010-10-11 NOTE — Discharge Summary (Signed)
NAME:  John [name], John [name]                         ACCOUNT NO.:  [id number]   MEDICAL RECORD NO.:  [id number]                   PATIENT TYPE:  INP   LOCATION:  3101                                 FACILITY:  [facility]   PHYSICIAN:  Clydene Fake, M.D.               DATE OF BIRTH:  04/21/1924   DATE OF ADMISSION:  04/25/2002  DATE OF DISCHARGE:  05/03/2002                                 DISCHARGE SUMMARY   ADMISSION DIAGNOSIS:  Chronic subdural hematoma.   DISCHARGE DIAGNOSIS:  Chronic subdural hematoma.   PROCEDURES:  1. Craniotomy on the right and evacuation of subdural hematoma.  2. Left drainage of subdural hematoma.   REASON FOR ADMISSION:  The patient is a 77 year old white male with one-  month history of worsening confusion and five-day history of decreased  ability to ambulate.  He was taken to the emergency room where CT was done  showing right chronic subdural hepatoma, large with shift, and a small  collection of subdural blood on the left side also, also more chronic in  nature.  The patient was transferred to [facility] and admitted to ICU.   He is on Coumadin.  INR was 2.9, PT 26. We admitted to ICU where we  corrected he coagulopathy.   HOSPITAL COURSE:  The patient received vitamin K to correct coagulopathy  from Coumadin.  The patient remained stable over the next day or so.  He was  able to follow commands with residual confusion. On the following day,  05/06/2002, after coagulopathy correction, the patient was taken to the  operating room for evacuation of subdural.  He had a craniotomy on the right  for evacuation of subdural and drainage on the left.  Postop the patient was  transferred to the recovery room and to the intensive care unit.  He did  have drains in epidural space on the right, subgaleal space on the left.   He remained stable, alert and oriented x 3, no drift, moved all four  extremities the following day.  Drains were putting out some increased  fluid  over the first couple of days.  On 04/29/2002, CT scan of the head was done  showing no shift.  There was some fluid in the subdural space bilaterally,  slight residual collection on the right that was seen in the epidural space  and not the subdural.  Overall, he looked good and clinically improved.  Fluid appeared to be CSF and not any blood.  Drains were discontinued on  04/29/2002.  He was transferred to the stepdown on that day also.   He started increasing his activity.  PT was consulted for patient.  He  continued improving over the next few days.  Checked a CT again 05/02/2002  which looked good.  We obtained consultation and were working on getting the  patient up to rehabilitation.  He started to ambulate with some problems,  very unsteady on his feet.  They agreed it would be appropriate, and on  05/03/2002 The patient was discharged to rehabilitation in stable condition.  All his incisions were healing well.                                               Clydene Fake, M.D.    JRH/MEDQ  D:  06/09/2002  T:  06/09/2002  Job:  [id number]

## 2010-10-11 NOTE — Discharge Summary (Signed)
NAME:  [name], [name]                         ACCOUNT NO.:  [id number]   MEDICAL RECORD NO.:  [id number]                   PATIENT TYPE:  IPS   LOCATION:  4031                                 FACILITY:  [facility]   PHYSICIAN:  Ranelle Oyster, M.D.             DATE OF BIRTH:  March 10, 1924   DATE OF ADMISSION:  05/03/2002  DATE OF DISCHARGE:  05/12/2002                                 DISCHARGE SUMMARY   DISCHARGE DIAGNOSES:  1. Bilateral subdural hematoma.  2. Apostasis.  3. Atrial fibrillation.  4. History of hypertension.   HISTORY OF PRESENT ILLNESS:  The patient is a 77 year old male with past  history of atrial fibrillation admitted from [name] on April 25, 2002, for further evaluation of confusion, headache and worsening ability to  ambulate.  Head CT revealed bilateral chronic SDH with shift, right greater  than left.  The patient underwent craniotomy for evacuation of SDH on  April 26, 2002, by Dr. Phoebe Perch.  The patient is ambulating 12 feet hand  held assist +2, transfer with moderate assist, bed mobility minimal assist.  No signs of postoperative complications thus far.  The patient was  transferred to [name] on May 03, 2002.   PAST MEDICAL HISTORY:  Significant as above.  Hypertension.   PAST SURGICAL HISTORY:  Significant for hand surgery, neck surgery secondary  to cyst.  Breast surgery secondary to cyst.   PRIMARY CARE PHYSICIAN:  [name], M.D.   CARDIOLOGY:  [name], M.D.   SOCIAL HISTORY:  Patient lives [place] wife in a multilevel [name]  The patient is a Research scientist (physical sciences).  He was  walking with a cane secondary to OA.  He denies any tobacco or alcohol  abuse.  He still is driving, wife can assist some and son able to provide  some assistance.   REVIEW OF SYSTEMS:  Negative for chest pain, falls and ulcers.  No nausea or  vomiting.   [name] COURSE:  The  patient was admitted to [facility]  Department May 03, 2002, for comprehensive inpatient rehabilitation  where he received more than 3 hours of therapy daily.  The patient made very  little progress while in rehabilitation secondary to significant to episodes  of apostasis.  Although medication was ingested and the patient received and  abdominal binder, there was no improvement in apostasis.  The patient was  jumping in and out of atrial fibrillation throughout his stay in  rehabilitation.  He has also been followed by his primary care [name], Dr.  [name].  [name] contacted [name] for a consult regarding  his atrial fibrillation.  It was hindering him from performing his  therapies.  The patient remained on digoxin and Cardizem throughout most of  his stay in rehabilitation.  Due to a decrease in blood pressure, adjustment  of medication was  made in his Prinivil.  The Prinivil eventually was  discontinued.  The patient was consulted by [name]  on May 12, 2002, who recommended transfer to telemetry due to possibly  fluctuating heart rate from bradycardia status to tachycardia.  The patient  was transferred on May 12, 2002, to Dr. Vern [name] service for monitoring.     Junie Bame, P.A.                       Ranelle Oyster, M.D.    LH/MEDQ  D:  07/03/2002  T:  07/03/2002  Job:  [id number]   cc:   [name], M.D.  [address]  [place]  [place] [address]  Fax: [phone]   [facility]   Clydene Fake, M.D.  [address]  [place]  [place] [address]  Fax: [phone]

## 2010-10-11 NOTE — H&P (Signed)
NAME:  [name], [name]                         ACCOUNT NO.:  [id number]   MEDICAL RECORD NO.:  [id number]                   PATIENT TYPE:  INP   LOCATION:  3711                                 FACILITY:  [facility]   PHYSICIAN:  Jesse Sans. Wall, M.D. [facility]            DATE OF BIRTH:  December 25, 1923   DATE OF ADMISSION:  05/12/2002  DATE OF DISCHARGE:                                HISTORY & PHYSICAL   REASON FOR ADMISSION:  We were asked by Dr. Riley Kill and Dr. Thomasena Edis to  evaluate the patient for episodic dizziness. The patient transferred from  rehab to telemetry.   HISTORY OF PRESENT ILLNESS:  The patient is a 77 year old married white male  with a history of chronic atrial fibrillation diagnosed in 2001.  He was  admitted 04/25/02 for a subdural hematoma which was felt to be spontaneous.  He says, however, he has hit the cubbard on several occasions.   In rehab he has been unable to participate as much as they would like  because of some dizziness and weakness.  He has been noted to have an  increased heart rate and sometimes even a slow heart rate.   On 12/17 he had some dizziness and his blood pressure was to be found in the  70's systolically.  He was symptomatic and his heart rate was 128 by  electrocardiogram with no acute changes.   He has not had any chest pain or any dyspnea on exertion.  He really did not  have these problems prior to admission.   PAST FAMILY PSYCHIATRIC HISTORY:  1. He is status post right craniotomy and bur hole on 04/26/02 from a     subdural hematoma.  He has been on chronic anticoagulation which has now     been discontinued.  There is a note that when he came with this subdural     hematoma he was therapeutic on his Coumadin.  2. He has a history of hypertension.  3. He has a history of osteoarthritis.   MEDICATIONS:  1. Cardizem CD 180 mg a day.  2. Protonix 40 mg a day.  3. Multivitamin.  4. Senokot.  5. Aspirin 325 mg a day.  6. K-Dur 20 mEq a  day.  7. Digoxin 0.2 mg a day.  8. Ensure t.i.d.   SOCIAL HISTORY:  He lives in [place] with his wife.  I met her today.  He  was in sales prior to admission.  He has not smoked, he quit in 1970.  He  does not abuse alcohol.   FAMILY HISTORY:  His mother died of a stroke.  Father died of heart failure  and leukemia.  There is no known coronary disease in his family.   REVIEW OF SYSTEMS:  Other than the HPI, is noncontributory.   PHYSICAL EXAMINATION:  GENERAL:  A very pleasant gentleman with a good sense  of humor.  He  is in no acute distress.  Skin is warm and dry.  VITALS:  Blood pressure 110/72, his pulse is 70 and irregular, there seems  to be some fairly significant pauses on my exam, respiratory rate 18,  temperature 98.1, oxygen saturation 96% on room air.  HEENT:  Unremarkable except for his head being shaved and he has got a large  scar present.  He is wearing a toboggan to keep himself warm.  NECK:  No JVD, carotid upstrokes are equal bilaterally and without bruits.  LUNGS:  Clear to auscultation and percussion.  HEART:  Reveals variable S1 and S2 without gallop, rub or murmur.  SKIN:  Reveals no lesions.  ABDOMEN:  Soft, good bowel sounds.  EXTREMITIES:  Reveal no cyanosis, clubbing or edema.  Pulses are intact.  NEUROLOGICAL:  Grossly intact.   LABORATORY AND ACCESSORY DATA:  Chest x-ray on 12/01 showed no acute  cardiopulmonary disease.  Electrocardiogram on 12/16 showed atrial  fibrillation with a rapid ventricular rate of 128 per minute.   Potassium 3.2, creatinine 0.8, digoxin 0.6.   ASSESSMENT:  1. Chronic atrial fibrillation now with what appears to be tachybrady at     least historically and by my exam today.  This seems to correlate with     symptoms and also a drop in his blood pressure.  This needs to be     verified by telemetry and careful monitoring.  2. Status post subdural hematoma on therapeutic anticoagulation.  His     Coumadin has been stopped  and he is on aspirin 325 mg a day.  3. Hypertension.   PLAN:  1. Admit to telemetry.  2. Hold Cardizem CD for the time being.  3. If he has significant tachybrady syndrome, he will need permanent     pacemaker implantation.                                               Thomas C. Daleen Squibb, M.D. [facility]    TCW/MEDQ  D:  05/12/2002  T:  05/13/2002  Job:  [id number]   cc:   Ellwood Dense, M.D.  [address]  [place]  [place] [address]  Fax: [phone]   [facility]   [facility]   [name], M.D.  [address]  [place]  [place] [address]  Fax: [phone]   Ranelle Oyster, M.D.  [address] [place]  [place] [address]  Fax: [phone]

## 2010-10-11 NOTE — Op Note (Signed)
NAME:  [name], [name]                         ACCOUNT NO.:  [id number]   MEDICAL RECORD NO.:  [id number]                   PATIENT TYPE:  INP   LOCATION:  3711                                 FACILITY:  [facility]   PHYSICIAN:  [name], M.D. [facility]           DATE OF BIRTH:  12-05-1923   DATE OF PROCEDURE:  05/18/2002  DATE OF DISCHARGE:                                 OPERATIVE REPORT   PREOPERATIVE DIAGNOSIS:  Atrial fibrillation with uncontrolled ventricular  responses with AV ablation.   POSTOPERATIVE DIAGNOSIS:  Atrial fibrillation with uncontrolled ventricular  responses with AV ablation.   PROCEDURE PERFORMED:  Dual chamber pacemaker implantation.   CARDIOLOGIST:  [name], M.D. [facility]   DESCRIPTION OF PROCEDURE:  On obtaining informed consent, the patient was  brought to the electrophysiology laboratory and placed on the fluoroscopic  table in the supine position.  After routine prep and drape of the left  upper chest, intravenous contrast was injected via the left antecubital vein  to identify the course and patency of the extrathoracic and left subclavian  vein.  This having been accomplished, lidocaine was infiltrated in the  prepectoral subclavicular region and an incision was made and carried down  to the layer of the prepectoral fascia using electrocautery.  A pocket was  formed using electrocautery.  Hemostasis was obtained.   Thereafter, attention was turned to gaining access to the extrathoracic and  left subclavian vein which was accomplished without difficulty.  Separate  venipunctures were accomplished.  A guidewire was then placed and retained  and a #0 silk suture was placed in figure-of-eight fashion and allowed to  hang loosely.   Sequentially, 7-French tear-away introducer sheaths were placed through  which were passed a pace setter 1688TC 58 cm active fixation atrial lead  serial #[id number] and a 1688TC 52 cm active fixation atrial lead serial  #[id number].  Under fluoroscopic guidance, these were manipulated into the left  ventricular apex and right atrial appendage respectively.  At these  locations, bipolar R-wave was 18 mV with a pacing impedance of 724 Ohms and  pacing threshold of 0.9V at 0.9 ms.  There was no diaphragmatic pacing at  10V.   Bipolar fibrillation was 1.67mV with a pacing impedance of 540 Ohms.  With  these acceptable parameters recorded, the leads were secured to the  prepectoral fascia and then attached to a Pacesetter Identity ADXTR pulse  generator serial [id number].  The pocket was copiously irrigated with  antibiotic-containing saline solution.  The leads and pulse generator were  placed in the pocket and secured to prepectoral fascia.  Hemostasis was  obtained.   The wound was then closed in three layers in normal fashion.  The wound was  washed and dried.  After closed, reinterrogation demonstrated that the R-  wave was now 3 mV.  Because of this it was elected to open the pocket and  reposition the  lead.  This was done and the lead was repositioned in the  right ventricular outflow tract where the bipolar R-wave was 17 mV but again  within three minutes it had diminished to less than 4 mV.   At this point, it was elected to abandon the previously implanted lead and a  Medtronic 5076 58 cm active fixation atrial lead [id number] was inserted  through a separate venipuncture and placed in the right ventricular apex.  At this site, the bipolar R-wave was approximately  8-11 mV with a pacing  impedance of 562 Ohms and a pacing threshold of 0.6V at 0.5 ms, current  threshold of 0.6 ma and there was no diaphragmatic pacing at 10V.  With  these acceptable parameters recorded, the lead was then attached back to the  Identity pulse generator, placed back in the pocket, and secured to the  prepectoral fascia.   The wound was then closed in three layers in normal fashion.  The wound was  then washed and dried and  Benzoin and Steri-Strips dressing was then  applied.   Because of the difficulties with the ventricular lead stability and the  variability in the R-waves which may be related to very rapid rates and  intracardiac aberration, it was elected to defer his AV ablation until  Friday.                                               [name], M.D. [facility]    SCK/MEDQ  D:  05/18/2002  T:  05/18/2002  Job:  [id number]

## 2010-10-11 NOTE — Op Note (Signed)
NAME:  [name], [name]                         ACCOUNT NO.:  [id number]   MEDICAL RECORD NO.:  [id number]                   PATIENT TYPE:  AMB   LOCATION:                                       FACILITY:  [facility]   PHYSICIAN:  Vikki Ports, M.D.         DATE OF BIRTH:  April 22, 1924   DATE OF PROCEDURE:  DATE OF DISCHARGE:                                 OPERATIVE REPORT   PREOPERATIVE DIAGNOSIS:  Subcutaneous mass of posterior head.   POSTOPERATIVE DIAGNOSIS:  Subcutaneous mass of posterior head.   PROCEDURE:  Excision of subcutaneous mass of the posterior head.   ANESTHESIA:  General anesthesia.   DESCRIPTION OF PROCEDURE:  The patient was taken to the operating room and  placed in the supine position.  After adequate general anesthesia was  induced, the patient was placed in the lateral position.  An elliptical  incision was made around the main portion of the mass, dissecting down  through skin and subcutaneous tissue around a very well encapsulated mass  consistent with a lipoma.  This was fairly destructive in the posterior  aspect and densely adhered with the underlying fascia but was able to be  excised off in its entirety.  Adequate hemostasis was insured and the skin  was closed with a subcuticular 3-0 Monocryl.  Sterile dressing was applied.  The patient tolerated the procedure well and went to PACU in good condition.                                               Vikki Ports, M.D.    KRH/MEDQ  D:  12/13/2003  T:  12/13/2003  Job:  [id number]

## 2010-10-11 NOTE — H&P (Signed)
   NAME:  [name], [name]                         ACCOUNT NO.:  [id number]   MEDICAL RECORD NO.:  [id number]                   PATIENT TYPE:  INP   LOCATION:  3112                                 FACILITY:  [facility]   PHYSICIAN:  Clydene Fake, M.D.               DATE OF BIRTH:  [birth year]   DATE OF ADMISSION:  04/25/2002  DATE OF DISCHARGE:                                HISTORY & PHYSICAL   CHIEF COMPLAINT:  Confusion and trouble walking.   HISTORY:  The patient is a 77 year old white male who, for the last month or  so, has had worsening confusion and for the last five days, has had the  worsening of ability to ambulate and was taken by wife to the emergency  room.  There, a V/Q was done showing right chronic subdural hematoma with  shift from right to left and a small left subdural collection; the patient  was transferred to [facility].  Patient without complaints.   PAST MEDICAL HISTORY:  Past medical history is significant for atrial  fibrillation, low back pain and hypertension.   CURRENT MEDICATIONS:  Coumadin, Cardizem, digoxin, Maxzide.   ALLERGIES:  No known drug allergies.   REVIEW OF SYSTEMS:  Review of systems is unobtainable due to the patient's  confusion.   FAMILY HISTORY:  Noncontributory.   SOCIAL HISTORY:  He is retired.   PHYSICAL EXAMINATION:  NEUROLOGIC:  On exam, the patient is awake, alert.  He is oriented to name, place and date and time but does not know why he is  in the hospital and cannot answer other more difficult questions.  He is  able to follow commands.  There is no drift.  Motor strength seems intact.  Cranial nerves II-XII were examined and are intact with pupils reactive.  Gait is deferred at this point.   LABORATORY DATA:  PT is 26.2 with an INR of 2.9.   ASSESSMENT AND PLAN:  Patient with a chronic subdural hematoma, much larger  on the right.  We will admit to the intensive care unit to correct his  coagulopathy and then plan craniotomy  for subdural on the right and possible  drainage on the left.  We discussed this with the patient's wife and  daughter.                                               Clydene Fake, M.D.    JRH/MEDQ  D:  04/25/2002  T:  04/25/2002  Job:  [id number]

## 2010-10-11 NOTE — Assessment & Plan Note (Signed)
[facility]                         GASTROENTEROLOGY OFFICE NOTE   [name], [name]                      MRN:          [id number]  DATE:06/30/2006                            DOB:          August 23, 1923    PROBLEM:  1. Ulcerative colitis.  2. Change in bowel habits.   HISTORY:  [name] is a pleasant 77 year old white male known to Dr.  Russella Dar, who has history of left-sided ulcerative colitis, which has been  relatively inactive over the past few years.  His last colonoscopy was  done in 2002, and he did have some moderate activity in the sigmoid  colon at that time.  Patient relates that he has probably had colitis  for 20-plus years all together.  He is currently on low-dose Colazal at  750 b.i.d.  His primary complaint today is a change in his bowel habits.  We have not seen him for almost a year, at which time he was complaining  of diarrhea.  However, now he has been having problems with  constipation.  He says he is having a bowel movement almost every day,  but having hard stools, and about every 5th day, may be constipated and  not have a bowel movement at all.  Usually, when this happens, he starts  taking Senokot for a couple of days and gets himself back on track.  He  says there seems to be a pattern to this where he will go for 4 or 5  days and then get constipated.  He denies any abdominal cramping or  pain.  Has not noted any melena or hematochezia.  He has been having  some problems with that sounds like neuropathy symptoms, and has been  taking Ultracet at bedtime, and is now on Cymbalta over the past several  months for the neuropathy-type symptoms.   CURRENT MEDICATIONS:  1. Cymbalta 60 mg daily.  2. Cartia XT 300 mg daily.  3. Aspirin 325 mg daily.  4. Triamterene/hydrochlorothiazide 37/25 daily.  5. Colazal 750 b.i.d.  6. Lanoxin 0.125 daily.  7. Metoprolol 50 b.i.d.  8. Ultracet 2 nightly.  9. Selenium daily.  10.L-lysine daily.  11.__________ daily.  12.B12 and B6 daily.  13.Maxzide 25 mg daily.  14.Multivitamin daily.  15.Glucosamine supplement daily.   ALLERGIES:  INTOLERANT TO:  1. __________ .  2. CELEBREX.  3. BEXTRA.  4. __________ .   PAST MEDICAL HISTORY:  Pertinent for hypertension, atrial fibrillation  with bradycardia.  He is now status post pacemaker.  He had a bilateral  subdural hemorrhage which required craniotomy in 2003.   EXAMINATION:  Well-developed elderly white male, on no acute distress.  He is pleasant.  Weight is 205.  Blood pressure 112/62.  Pulse is 64.  CARDIOVASCULAR:  Regular rate and rhythm with S1 and S2.  PULMONARY:  Clear to A and P.  ABDOMEN:  Soft.  Bowel sounds are active.  He is non-tender.  There is  no mass or hepatosplenomegaly.  RECTAL:  Exam is not done today.   IMPRESSION:  1. Left-sided ulcerative colitis, stable.  Clinically in remission.  2. Constipation.  Likely medication related, i.e., Cymbalta and      Ultracet.   PLAN:  1. Continue low-dose Colazal for the time being.  2. Schedule followup colonoscopy.  3. Add Colace on a daily basis and Milk of Magnesia 30 cc or a tablet      every other day.      Mike Gip, PA-C  Electronically Signed      Venita Lick. Russella Dar, MD, [facility]  Electronically Signed   AE/MedQ  DD: 06/30/2006  DT: 06/30/2006  Job #: [id number]

## 2010-10-11 NOTE — Assessment & Plan Note (Signed)
[facility]                         ELECTROPHYSIOLOGY OFFICE NOTE   [name], John [name]                      MRN:          [id number]  DATE:06/08/2006                            DOB:          23-Oct-1923    [name] was seen today in the clinic on June 08, 2006, for  followup of his [employer] model number [id number] Identity.  Date of implant  was May 18, 2002, for atrial fibrillation with an AV ablation.  On  interrogation of his device today, his battery voltage is 2.75.  R waves  measured 8.0 to 10.1 millivolts with a ventricular pacing threshold of  0.625 volts at 0.5 milliseconds and a ventricular lead impedance of 462  ohms.  He is not pacemaker dependent.  He has an underlying atrial  fibrillation at 51 beats a minute.  AutoCapture is programmed on.  There  were no changes in parameters.  He will start with telephone checks on a  monthly basis with [employer] and a return office visit in 1 year's time.      Altha Harm, LPN  Electronically Signed      [name], MD, [facility]  Electronically Signed   PO/MedQ  DD: 06/08/2006  DT: 06/08/2006  Job #: [id number]

## 2010-10-11 NOTE — Discharge Summary (Signed)
NAME:  John [name], John [name]                         ACCOUNT NO.:  [id number]   MEDICAL RECORD NO.:  [id number]                   PATIENT TYPE:  IPS   LOCATION:  4034                                 FACILITY:  [facility]   PHYSICIAN:  Ellwood Dense, M.D.                DATE OF BIRTH:  1924/01/10   DATE OF ADMISSION:  05/23/2002  DATE OF DISCHARGE:  05/31/2002                                 DISCHARGE SUMMARY   DISCHARGE DIAGNOSES:  1. Status post craniotomy secondary to [facility].  2. Atrial fibrillation with bradycardia.  3. History of hypertension.   HISTORY OF PRESENT ILLNESS:  The patient is a 77 year old white male  originally admitted to rehabilitation on 05/03/2002 after sustaining a  bilateral subdural hemorrhage status post craniotomy for evacuation.  The  patient was transferred to cardiology for evaluation of atrial fibrillation  and bradycardia with decreased blood pressure.  The patient was placed on  the telemetry unit and underwent dual-chamber pacemaker implantation on  05/18/2002 by [name], M.D.  PT report at this time indicated the  patient was ambulating minimum assistance approximately 90 feet with rolling  walker, transfer sit to stand with close supervision.  The patient was  transferred to [facility] Department on 05/23/2002.   PAST MEDICAL HISTORY:  1. Hypertension.  2. Atrial fibrillation.   PAST SURGICAL HISTORY:  1. Hand surgery.  2. Breast surgery secondary to cyst.   PRIMARY CARE PHYSICIAN:  [name], M.D., cardiology at [facility].   SOCIAL HISTORY:  The patient lives with wife in a multi-level home in  [place], [place].  The patient is a Retail banker.  He is walking with a cane secondary to osteoarthritis.  Denies tobacco or  alcohol.  Still diving.  Wife and son can assist and able to provide  assistance at discharge.   REVIEW OF SYSTEMS:  Significant for chest pain and falls, ulcers.  Denies  any nausea  or vomiting.   FAMILY HISTORY:  Mother is deceased.  Father deceased, chronic leukemia.   HOSPITAL COURSE:  The patient was admitted to [facility] on  05/23/2002 for comprehensive inpatient rehabilitation and received more than  three hours of therapy daily.  Hospital course was significant for the  following.   1. STATUS POST CRANIOTOMY SECONDARY TO SUBDURAL:  Overall, the patient did     very well during second stay on rehabilitation.  He was discharged at     modified independent level.  Able to ambulate greater than 100 feet     modified independently with rolling walker, transfer sit to stand     modified independently.  He had no significant neurologic events occur     while on rehabilitation.  He remained on aspirin 325 mg p.o. daily for     CVA prophylaxis.  Surgical incision healed very well.  The patient had no     significant  headaches while in rehabilitation.   1. HISTORY OF ATRIAL FIBRILLATION AND BRADYCARDIA:  The patient is status     post pacemaker implantation. The patient had no significant chest pain.     Heart rate was under control.  The patient continued to be on Cardizem     300 mg p.o. daily as well as Lanoxin 0.187 mg p.o. daily and Lopressor 50     mg p.o. q.12h.  No adjustments were necessary in any of his medications.   1. HISTORY OF HYPERTENSION:  Blood pressure remained under fair control     while in rehabilitation.  Systolic blood pressure ran from 92 to 128,     diastolic blood pressure from 50 to 90s.  The patient had no significant     symptoms or decrease in blood pressure.  He remained on the same     medications as noted above, and no adjustments were necessary.   Overall, no other significant issues occurred while on rehabilitation.  At  time of discharge, all vital signs were stable.  Urine culture performed on  05/23/2002 showed 4000 colonies, insignificant growth.  Latest potassium  level was 3.9.  Latest sodium level was 140,  chloride 105, CO2 28, BUN 12,  creatinine 0.9, glucose 98.  Latest hemoglobin 12.6, hematocrit 36.8,  platelet count 257.  One hemoccult blood performed on 05/24/2002 was  negative.  Latest digoxin level performed on 05/24/2002 was 0.3.   At time of discharge, all vital signs were stable.  Latest blood pressure at  time of discharge was 134/82, respiratory rate 18, pulse 88, temperature  97.3.  PT report indicated patient ambulating modified independently 150  feet with rolling walker, transfer sit to stand modified independently, bed  mobility modified independent.  He could perform all ADLs modified  independently supervision level.  Overall, the patient had made excellent  progress reaching modified independent level with rolling walker in home  environment.  He continued to need close supervision for long distance  ambulation and out doors and on level surfaces.  PT instructed patient about  safety at home.  The patient continued to require occasional reinforcement  on safety precautions.  Overall for OT, the patient has made great progress  towards goals.  He is ready for discharge home at modified independent  level.   DISPOSITION:  The patient is discharged home with his wife.   DISCHARGE MEDICATIONS:  1. Aspirin 325 mg 1 tablet dialy.  2. Cardizem 300 mg 1 tablet daily.  3. Digoxin 0.187 mg daily.  4. Lopressor 50 mg 1 tablet every 12 hours.  5. Multivitamins 1 tablet daily.   DISCHARGE INSTRUCTIONS:  Do not take Coumadin for now.  No Lisinopril, no  Maxzide.  Pain medication is Tylenol.  Use walker.  No drinking, no driving  or alcohol.  He is to follow up with [facility] in two weeks, follow  up with Dr. [name] within four weeks, follow up with Dr. Ranelle Oyster on February 23 at 10 o'clock.     Junie Bame, P.A.                       Ellwood Dense, M.D.    LH/MEDQ  D:  05/31/2002  T:  05/31/2002  Job:  [id number]  cc:   [name], M.D.   [address]  [place]  [place] [address]  Fax: [phone]   [facility]   [name], M.D.  [facility]   Ranelle Oyster, M.D.  [address] [place]  [place] [address]  Fax: [phone]

## 2010-10-11 NOTE — Discharge Summary (Signed)
[place]  Patient:    John [name], John [name]                      MRN: [id number] Adm. Date:  [id number] Disc. Date: 07/09/99 Attending:  Julian Hy                           Discharge Summary  DISCHARGE DIAGNOSES: 1. Atrial fibrillation with rapid ventricular response. 2. Essential hypertension. 3. Osteoarthritis.  HISTORY OF PRESENT ILLNESS:  John [name] is a 77 year old white male with essential hypertension and osteoarthritis who presents with breathlessness and diaphoresis after being awakened at 3 a.m.  He had one similar prior episode in December, which spontaneously resolved and has no known prior cardiac history.  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 He, from a health maintenance standpoint, is up-to-date and has been feeling well beyond his current sensation.  He presented to the emergency room, where he was  found to be in atrial fibrillation with a rapid ventricular response.  For details, see the dictated summary admission note by my partner on July 06, 1999.  ADMISSION LABORATORY DATA:  Chemistries:  Sodium 136, potassium 3.4, chloride 111, CO2 28, glucose 132, BUN 21, creatinine 0.9, calcium 8.6, protein 6.8, albumin .6, SGOT 31, SGPT 27, alkaline phosphatase 43, bilirubin 0.8.  CKs were 90, 121 respectively.  CBC:  Hemoglobin 15.8, hematocrit 44.8, white blood cell count 9.6, platelet count 241,000.  TSH was normal at 1.71.  Chest x-ray:  Questionable early left basilar pneumonia but clinically no concern. Cardiomegaly and mild vascular congestion.  EKG:  Undetermined axis, probable sinus rhythm.  Lateral infarct, age undetermined.  Initially, EKG atrial fibrillation  with rapid ventricular response.  HOSPITAL COURSE:  The patient was admitted and placed on IV Cardizem, subcu Lovenox, and begun on Coumadin.  Cardiac enzymes were negative and  cardiology was indeed consulted with echocardiogram performed.  Results pending at this time. The patient spontaneously converted and the need for TEE/digital cardioversion was ot needed.  The patient became therapeutic on Coumadin and Lovenox discontinued. e was converted to oral Cardizem and remained in sinus rhythm.  It was felt that e is best a candidate for long-term anticoagulation, as this is probably the second episode.  Echocardiogram is pending at the time of this discharge and he will be followed up as an outpatient.  He is discharged in improved and stable condition after Coumadin education.  DISCHARGE MEDICATIONS: 1. Zestoretic 20/12.5 1 daily. 2. Vioxx 25 daily. 3. Coumadin 5 mg p.o. q.d. 4. Cardizem CD 180 p.o. q.d.  ACTIVITIES:  As tolerated.  FOLLOW-UP:  He is to come to lab one time a week for four weeks and monthly thereafter to monitor Coumadin.  He is to see [name] in four weeks. DD:  07/09/99 TD:  07/09/99 Job: [id number]

## 2010-11-28 ENCOUNTER — Other Ambulatory Visit: Payer: Self-pay | Admitting: Internal Medicine

## 2010-11-28 ENCOUNTER — Ambulatory Visit (INDEPENDENT_AMBULATORY_CARE_PROVIDER_SITE_OTHER): Payer: Medicare Other | Admitting: *Deleted

## 2010-11-28 DIAGNOSIS — I442 Atrioventricular block, complete: Secondary | ICD-10-CM

## 2010-11-28 DIAGNOSIS — I4891 Unspecified atrial fibrillation: Secondary | ICD-10-CM

## 2010-11-28 DIAGNOSIS — Z950 Presence of cardiac pacemaker: Secondary | ICD-10-CM

## 2010-11-28 LAB — REMOTE PACEMAKER DEVICE
BAVD-0005RV: 120 {beats}/min
BMOD-0002RV: 12
BPAC-0002RV: 2.5 V
BSEN-0004RV: 250 ms
DEV-0006LDO: 20031224
DEV-0006PM: 20101216
DEV-0023LDO: 0
DEVICE MODEL PM: 2309283
RV LEAD AMPLITUDE: 10.3 mv
RV LEAD AMPLITUDE: 10.3 mv

## 2010-12-02 ENCOUNTER — Encounter: Payer: Self-pay | Admitting: *Deleted

## 2010-12-06 NOTE — Progress Notes (Signed)
Pacer remote check  

## 2011-02-26 LAB — POCT I-STAT 3, VENOUS BLOOD GAS (G3P V)
Bicarbonate: 31.8 — ABNORMAL HIGH
O2 Saturation: 39
pCO2, Ven: 52 — ABNORMAL HIGH
pCO2, Ven: 55.9 — ABNORMAL HIGH
pH, Ven: 7.394 — ABNORMAL HIGH

## 2011-02-26 LAB — POCT I-STAT 3, ART BLOOD GAS (G3+)
Bicarbonate: 29.6 — ABNORMAL HIGH
O2 Saturation: 67
TCO2: 35
pCO2 arterial: 45.7 — ABNORMAL HIGH
pCO2 arterial: 52.2 — ABNORMAL HIGH
pO2, Arterial: 35 — CL
pO2, Arterial: 76 — ABNORMAL LOW

## 2011-02-27 ENCOUNTER — Encounter: Payer: Medicare Other | Admitting: *Deleted

## 2011-03-03 ENCOUNTER — Encounter: Payer: Self-pay | Admitting: *Deleted

## 2011-03-08 ENCOUNTER — Other Ambulatory Visit: Payer: Self-pay | Admitting: Internal Medicine

## 2011-03-10 ENCOUNTER — Telehealth: Payer: Self-pay | Admitting: Internal Medicine

## 2011-03-11 ENCOUNTER — Encounter: Payer: Self-pay | Admitting: Internal Medicine

## 2011-03-11 ENCOUNTER — Ambulatory Visit (INDEPENDENT_AMBULATORY_CARE_PROVIDER_SITE_OTHER): Payer: Medicare Other | Admitting: Internal Medicine

## 2011-03-11 DIAGNOSIS — Z950 Presence of cardiac pacemaker: Secondary | ICD-10-CM

## 2011-03-11 DIAGNOSIS — I442 Atrioventricular block, complete: Secondary | ICD-10-CM

## 2011-03-11 DIAGNOSIS — I509 Heart failure, unspecified: Secondary | ICD-10-CM

## 2011-03-11 DIAGNOSIS — I4891 Unspecified atrial fibrillation: Secondary | ICD-10-CM

## 2011-03-11 DIAGNOSIS — I5032 Chronic diastolic (congestive) heart failure: Secondary | ICD-10-CM

## 2011-03-11 LAB — PACEMAKER DEVICE OBSERVATION
BATTERY VOLTAGE: 2.9629 V
BMOD-0002RV: 12
RV LEAD AMPLITUDE: 10 mv
RV LEAD IMPEDENCE PM: 537.5 Ohm
VENTRICULAR PACING PM: 51

## 2011-03-11 NOTE — Assessment & Plan Note (Signed)
The patient's device was interrogated.  The information was reviewed. No changes were made in the programming.    

## 2011-03-11 NOTE — Assessment & Plan Note (Signed)
Permanent. Adequate rate control not on Coumadin because of subdural hematoma

## 2011-03-11 NOTE — Progress Notes (Signed)
HPI  John [name] is a 77 y.o. male Seen in followup for atrial fibrillation which is permanent status post AV junction ablation with resumption of antegrade conduction. He status post pacemaker implantation. There has been significant interval improvement in his nonischemic myopathy with EF  going from 30%-55%.  Has history of subdural hematoma; has he does not take Coumadin  Last cath 01/2008 demonstrated luminal irregs. Echo in 04/2009: EF 50-55%, mod LVH, mild AI, mild MVP of post leaflet with mod MR, severe BAE and PASP 45.   AG complaint is swelling. His limitations are related to his arthritis    Past Medical History  Diagnosis Date  . Permanent atrial fibrillation     NOT CANDIDATE FOR COUMADIN  . Bradycardia     AV JUNCTION ABLATION W/RESUMPTION ON CONDUCTION S/P StJUDE PACEMAKER-2011 REVISION/St.JUDE ACCENT SR  . Hypertension   . Subdural hematoma     WHILE ON COUMADIN  . Colitis     Past Surgical History  Procedure Date  . Subdural hematoma evacuation via craniotomy   . Cystectomy     BREAST, NECK  . Hand surgery   . Insert / replace / remove pacemaker     PACEMAKER IMPLANT 2003-2010.Marland KitchenMarland KitchenREVISION St, JUDE ACCENT SR    Current Outpatient Prescriptions  Medication Sig Dispense Refill  . aspirin 81 MG EC tablet Take 81 mg by mouth daily.        Marland Kitchen CALCIUM-VITAMIN D PO Take by mouth daily. Calcium 1200, vit d 1000       . carvedilol (COREG) 25 MG tablet Take 25 mg by mouth 2 (two) times daily with a meal.        . diclofenac sodium (VOLTAREN) 1 % GEL Apply 1 application topically as needed.        . enalapril (VASOTEC) 10 MG tablet Take 10 mg by mouth daily.        . ergocalciferol (VITAMIN D2) 50000 UNITS capsule Take 50,000 Units by mouth once a week.        . escitalopram (LEXAPRO) 10 MG tablet Take 10 mg by mouth daily.        . fish oil-omega-3 fatty acids 1000 MG capsule Take 1 g by mouth daily.        . furosemide (LASIX) 40 MG tablet TAKE 2 TABLETS BY MOUTH  TWICE DAILY  120 tablet  0  . HYDROcodone-acetaminophen (NORCO) 5-325 MG per tablet Take 1 tablet by mouth 2 (two) times daily as needed. TAKE 1/2 TAB BID PRN       . Multiple Vitamin (MULTIVITAMIN) tablet Take 1 tablet by mouth daily.        . Multiple Vitamins-Minerals (B-PLEX PLUS) TABS Take 1 tablet by mouth 2 (two) times daily.        . potassium chloride SA (K-DUR,KLOR-CON) 20 MEQ tablet TAKE 1 TAB IN THE AM AND 1 TAB in the PM.  60 tablet  6    Allergies  Allergen Reactions  . Celecoxib     REACTION: unspecified  . Warfarin Sodium     Review of Systems negative except from HPI and PMH  Physical Exam Well developed and well nourished in no acute distress HENT normal E scleral and icterus clear Neck Supple JVP 10-11 cm; carotids brisk and full Clear to ausculation Irregular rate and rhythm, no murmurs gallops or rub Soft with active bowel sounds No clubbing cyanosis 2+ edema using support stockings Alert and oriented, grossly normal motor and sensory function Skin  Warm and Dry    Assessment and  Plan

## 2011-03-11 NOTE — Assessment & Plan Note (Signed)
We'll try to increase his diuretics.to take as prescribed

## 2011-03-11 NOTE — Patient Instructions (Signed)
Your physician wants you to follow-up in: 1 year with [name]. You will receive a reminder letter in the mail two months in advance. If you don't receive a letter, please call our office to schedule the follow-up appointment.  Remote monitoring is used to monitor your Pacemaker of ICD from home. This monitoring reduces the number of office visits required to check your device to one time per year. It allows [place] to keep an eye on the functioning of your device to ensure it is working properly. You are scheduled for a device check from home on 06/12/11. You may send your transmission at any time that day. If you have a wireless device, the transmission will be sent automatically. After your physician reviews your transmission, you will receive a postcard with your next transmission date.  Your physician recommends that you continue on your current medications as directed. Please refer to the Current Medication list given to you today.

## 2011-03-22 ENCOUNTER — Other Ambulatory Visit: Payer: Self-pay | Admitting: Internal Medicine

## 2011-04-08 ENCOUNTER — Other Ambulatory Visit: Payer: Self-pay | Admitting: Internal Medicine

## 2011-06-12 ENCOUNTER — Encounter: Payer: Medicare Other | Admitting: *Deleted

## 2011-06-16 ENCOUNTER — Encounter: Payer: Self-pay | Admitting: *Deleted

## 2011-06-19 ENCOUNTER — Encounter: Payer: Self-pay | Admitting: Internal Medicine

## 2011-06-19 ENCOUNTER — Ambulatory Visit (INDEPENDENT_AMBULATORY_CARE_PROVIDER_SITE_OTHER): Payer: Medicare Other | Admitting: *Deleted

## 2011-06-19 DIAGNOSIS — I4891 Unspecified atrial fibrillation: Secondary | ICD-10-CM

## 2011-06-19 DIAGNOSIS — Z950 Presence of cardiac pacemaker: Secondary | ICD-10-CM

## 2011-06-19 LAB — REMOTE PACEMAKER DEVICE
BRDY-0004RV: 120 {beats}/min
BRDY-0005RV: 60 {beats}/min
RV LEAD AMPLITUDE: 10.2 mv

## 2011-06-24 ENCOUNTER — Encounter: Payer: Self-pay | Admitting: *Deleted

## 2011-06-24 NOTE — Progress Notes (Signed)
Remote pacer check  

## 2011-06-27 ENCOUNTER — Other Ambulatory Visit: Payer: Self-pay | Admitting: Internal Medicine

## 2011-08-04 ENCOUNTER — Emergency Department (HOSPITAL_COMMUNITY): Payer: Medicare Other

## 2011-08-04 ENCOUNTER — Emergency Department (HOSPITAL_COMMUNITY)
Admission: EM | Admit: 2011-08-04 | Discharge: 2011-08-05 | Disposition: A | Discharge status: Home / Self Care | Payer: Medicare Other | Attending: Emergency Medicine | Admitting: Emergency Medicine

## 2011-08-04 DIAGNOSIS — J9819 Other pulmonary collapse: Secondary | ICD-10-CM | POA: Insufficient documentation

## 2011-08-04 DIAGNOSIS — Z79899 Other long term (current) drug therapy: Secondary | ICD-10-CM | POA: Insufficient documentation

## 2011-08-04 DIAGNOSIS — R531 Weakness: Secondary | ICD-10-CM

## 2011-08-04 DIAGNOSIS — R5381 Other malaise: Secondary | ICD-10-CM | POA: Insufficient documentation

## 2011-08-04 DIAGNOSIS — Z950 Presence of cardiac pacemaker: Secondary | ICD-10-CM | POA: Insufficient documentation

## 2011-08-04 DIAGNOSIS — M25559 Pain in unspecified hip: Secondary | ICD-10-CM | POA: Insufficient documentation

## 2011-08-04 DIAGNOSIS — M549 Dorsalgia, unspecified: Secondary | ICD-10-CM | POA: Insufficient documentation

## 2011-08-04 DIAGNOSIS — I10 Essential (primary) hypertension: Secondary | ICD-10-CM | POA: Insufficient documentation

## 2011-08-04 DIAGNOSIS — I4891 Unspecified atrial fibrillation: Secondary | ICD-10-CM | POA: Insufficient documentation

## 2011-08-04 MED ORDER — MORPHINE SULFATE 4 MG/ML IJ SOLN: 4.0000 mg | Freq: Once | INTRAMUSCULAR | Status: DC

## 2011-08-04 MED ORDER — SODIUM CHLORIDE 0.9 % IV BOLUS (SEPSIS)
500.0000 mL | Freq: Once | INTRAVENOUS | Status: AC
  Administered 2011-08-04: 500 mL via INTRAVENOUS

## 2011-08-04 NOTE — ED Notes (Signed)
[id number]<JY> Expected date:08/04/11<BR> Expected time: 9:10 PM<BR> Means of arrival:Ambulance<BR> Comments:<BR> EMS M130 GC -- Respiratory Distress

## 2011-08-04 NOTE — ED Notes (Signed)
UJW:[id number]<YN> Expected date:08/04/11<BR> Expected time: 7:35 PM<BR> Means of arrival:Ambulance<BR> Comments:<BR> EMS 70 GC - weakness

## 2011-08-04 NOTE — ED Notes (Signed)
Awaiting EPD evaluation

## 2011-08-04 NOTE — ED Notes (Signed)
Pt assisted X 2 to bathroom.

## 2011-08-05 ENCOUNTER — Other Ambulatory Visit: Payer: Self-pay

## 2011-08-05 ENCOUNTER — Emergency Department (HOSPITAL_COMMUNITY): Payer: Medicare Other

## 2011-08-05 LAB — URINE MICROSCOPIC-ADD ON

## 2011-08-05 LAB — URINALYSIS, ROUTINE W REFLEX MICROSCOPIC
Glucose, UA: NEGATIVE mg/dL
Ketones, ur: NEGATIVE mg/dL
Specific Gravity, Urine: 1.016 (ref 1.005–1.030)
pH: 7 (ref 5.0–8.0)

## 2011-08-05 LAB — BASIC METABOLIC PANEL
CO2: 30 mEq/L (ref 19–32)
Calcium: 9.1 mg/dL (ref 8.4–10.5)
Glucose, Bld: 88 mg/dL (ref 70–99)
Sodium: 136 mEq/L (ref 135–145)

## 2011-08-05 LAB — CBC
Hemoglobin: 12.1 g/dL — ABNORMAL LOW (ref 13.0–17.0)
MCH: 31.4 pg (ref 26.0–34.0)
RBC: 3.85 MIL/uL — ABNORMAL LOW (ref 4.22–5.81)

## 2011-08-05 NOTE — ED Notes (Signed)
Pt refused Morphine. Stated he would take some of his regular pain meds when He arrived back home. Offered Tylenol, but he states it doesn't really do him any good. Pt to go home by PTAR as he is presently unable to ambulate but for very short distance with assistance.

## 2011-08-05 NOTE — ED Provider Notes (Signed)
History     CSN: [id number]  Arrival date & time 08/04/11  1932   First MD Initiated Contact with Patient 08/04/11 2101      No chief complaint on file.   (Consider location/radiation/quality/duration/timing/severity/associated sxs/prior treatment) The history is provided by the patient and the spouse.   patient has a history of recurrent falls.  He fell 3 days ago and his wife said since the event he has been walking with a limp on his right leg.  He has been able to continue to ambulate minimally with his walker which is baseline for hand.  Most the time he gets around in the wheelchair.  The patient reports mild pain in his right anterior hip and in his low back.  Symptoms are worsened by movement and palpation.  Nothing improves his symptoms.  He has not tried anything at home besides hydrocodone for his pain.  The patient's wife called the orthopedist today recommendation to the ER for evaluation and an x-ray of his right hip.  The patient denies chest pain or shortness of breath.  He denies nausea vomiting or diarrhea.  He's continued to eat normally.  His no dysuria.  He has no other complaints.  He does have a history of atrial fibrillation however he is not on Coumadin as he is a fall risk and his had subdural hematomas in the past  Past Medical History  Diagnosis Date  . Permanent atrial fibrillation     NOT CANDIDATE FOR COUMADIN  . Bradycardia     AV JUNCTION ABLATION W/RESUMPTION ON CONDUCTION S/P StJUDE PACEMAKER-2011 REVISION/St.JUDE ACCENT SR  . Hypertension   . Subdural hematoma     WHILE ON COUMADIN  . Colitis     Past Surgical History  Procedure Date  . Subdural hematoma evacuation via craniotomy   . Cystectomy     BREAST, NECK  . Hand surgery   . Insert / replace / remove pacemaker     PACEMAKER IMPLANT 2003-2010.Marland KitchenMarland KitchenREVISION St, JUDE ACCENT SR    No family history on file.  History  Substance Use Topics  . Smoking status: Former Smoker    Types:  Cigarettes  . Smokeless tobacco: Not on file  . Alcohol Use: No      Review of Systems  All other systems reviewed and are negative.    Allergies  Celecoxib and Warfarin sodium  Home Medications   Current Outpatient Rx  Name Route Sig Dispense Refill  . ASPIRIN 81 MG PO TBEC Oral Take 81 mg by mouth daily.      Marland Kitchen CALCIUM-VITAMIN D PO Oral Take by mouth daily. Calcium 1200, vit d 1000     . CARVEDILOL 25 MG PO TABS Oral Take 25 mg by mouth 2 (two) times daily with a meal.      . DICLOFENAC SODIUM 1 % TD GEL Topical Apply 1 application topically as needed.      . ENALAPRIL MALEATE 10 MG PO TABS Oral Take 10 mg by mouth daily.      . ERGOCALCIFEROL 50000 UNITS PO CAPS Oral Take 50,000 Units by mouth once a week. On Sunday.    Marland Kitchen ESCITALOPRAM OXALATE 10 MG PO TABS Oral Take 10 mg by mouth daily.      . OMEGA-3 FATTY ACIDS 1000 MG PO CAPS Oral Take 1 g by mouth daily.     . FUROSEMIDE 40 MG PO TABS  TAKE 2 TABLETS BY MOUTH TWICE DAILY 120 tablet 2  . HYDROCODONE-ACETAMINOPHEN 5-325  MG PO TABS Oral Take 1 tablet by mouth every 4 (four) hours as needed. For pain    . ONE-DAILY MULTI VITAMINS PO TABS Oral Take 1 tablet by mouth daily.      . B-PLEX PLUS PO TABS Oral Take 1 tablet by mouth 2 (two) times daily.      Marland Kitchen POTASSIUM CHLORIDE CRYS ER 20 MEQ PO TBCR Oral Take 20 mEq by mouth 2 (two) times daily. TAKE AS DIRECTED.      BP 134/82  Pulse 79  Temp(Src) 97.2 F (36.2 C) (Oral)  Resp 24  SpO2 95%  Physical Exam  Nursing note and vitals reviewed. Constitutional: He is oriented to person, place, and time. He appears well-developed and well-nourished.  HENT:  Head: Normocephalic and atraumatic.  Eyes: EOM are normal.  Neck: Normal range of motion.  Cardiovascular: Normal rate, regular rhythm, normal heart sounds and intact distal pulses.   Pulmonary/Chest: Effort normal and breath sounds normal. No respiratory distress.  Abdominal: Soft. He exhibits no distension. There is no  tenderness.  Musculoskeletal:       Mild pain with range of motion of his right hip.  Bilateral feet are neurovascularly intact.  The patient is chronic stiffness of his bilateral knees.  He has tenderness of his lumbar spine as well as his right SI joint  Neurological: He is alert and oriented to person, place, and time.  Skin: Skin is warm and dry.  Psychiatric: He has a normal mood and affect. Judgment normal.    ED Course  Procedures (including critical care time)  Labs Reviewed  CBC - Abnormal; Notable for the following:    RBC 3.85 (*)    Hemoglobin 12.1 (*)    HCT 35.7 (*)    All other components within normal limits  BASIC METABOLIC PANEL - Abnormal; Notable for the following:    BUN 24 (*)    GFR calc non Af Amer 74 (*)    GFR calc Af Amer 86 (*)    All other components within normal limits  URINALYSIS, ROUTINE W REFLEX MICROSCOPIC - Abnormal; Notable for the following:    Leukocytes, UA SMALL (*)    All other components within normal limits  TROPONIN I  URINE MICROSCOPIC-ADD ON   Dg Chest 2 View  08/05/2011  *RADIOLOGY REPORT*  Clinical Data: Fall  CHEST - 2 VIEW  Comparison: 05/10/2009  Findings: Dual lead left subclavian pacemaker device is stable. Moderate cardiomegaly.  Low volumes.  Bibasilar atelectasis.  No pneumothorax.  IMPRESSION: Cardiomegaly and basilar atelectasis.  Original Report Authenticated By: Donavan Burnet, M.D.   Dg Lumbar Spine Complete  08/05/2011  *RADIOLOGY REPORT*  Clinical Data: Back pain  LUMBAR SPINE - COMPLETE 4+ VIEW  Comparison: 10/11/2007  Findings: Stable T12 compression deformity.  Severe degenerative change throughout the lumbar spine.  No new compression fracture. Very minimal dextroscoliosis with the apex at L3-4.  IMPRESSION: No acute bony pathology.  Chronic changes.  Original Report Authenticated By: Donavan Burnet, M.D.   Dg Hip Complete Right  08/05/2011  *RADIOLOGY REPORT*  Clinical Data: Right hip pain status post fall  RIGHT  HIP - COMPLETE 2+ VIEW  Comparison: None.  Findings: Osteopenia.  No displaced fracture or dislocation identified.  Advanced degenerative changes of the lower lumbar spine.  Bilateral hip DJD.  Phlebolith project over the pubic rami. The sacrum is partially obscured by overlying bowel.  IMPRESSION: No displaced fracture identified. If clinical concern for a fracture persists,  recommend a repeat radiograph in 5-10 days to evaluate for interval change or callus formation.  Original Report Authenticated By: Waneta Martins, M.D.   I personally reviewed the x-rays  No diagnosis found.    MDM  Fall 3 days ago.  His x-rays of his pelvis and L-spine are normal.  His pain is improved DC home with PCP and orthopedic followup        Lyanne Co, MD 08/05/11 [phone]

## 2011-08-05 NOTE — ED Notes (Signed)
Pt unable to amb at baseline, w/c bound

## 2011-08-13 ENCOUNTER — Other Ambulatory Visit: Payer: Self-pay | Admitting: Internal Medicine

## 2011-08-13 MED ORDER — POTASSIUM CHLORIDE CRYS ER 20 MEQ PO TBCR: 20.0000 meq | EXTENDED_RELEASE_TABLET | Freq: Two times a day (BID) | ORAL | Status: DC

## 2011-09-17 NOTE — Telephone Encounter (Signed)
Close  

## 2011-09-18 ENCOUNTER — Encounter: Payer: Medicare Other | Admitting: *Deleted

## 2011-09-18 ENCOUNTER — Telehealth: Payer: Self-pay | Admitting: Internal Medicine

## 2011-09-18 NOTE — Telephone Encounter (Signed)
Patient is having trouble transmitting, has spoken with help desk which was no help at all.  Patient has gained excess over the last couple of weeks.  Please return call to patient wife at [phone]

## 2011-09-19 NOTE — Telephone Encounter (Signed)
Spoke with wife, she is unable to send [employer] transmission.  The patient is scheduled to see [name] 09/22/11.  We will cancel his remote appointment for now.

## 2011-09-22 ENCOUNTER — Encounter: Payer: Self-pay | Admitting: Internal Medicine

## 2011-09-22 ENCOUNTER — Ambulatory Visit (INDEPENDENT_AMBULATORY_CARE_PROVIDER_SITE_OTHER): Payer: Medicare Other | Admitting: Internal Medicine

## 2011-09-22 VITALS — BP 150/88 | HR 81 | Ht 68.0 in | Wt 198.0 lb

## 2011-09-22 DIAGNOSIS — I509 Heart failure, unspecified: Secondary | ICD-10-CM

## 2011-09-22 DIAGNOSIS — I4891 Unspecified atrial fibrillation: Secondary | ICD-10-CM

## 2011-09-22 DIAGNOSIS — I5032 Chronic diastolic (congestive) heart failure: Secondary | ICD-10-CM

## 2011-09-22 DIAGNOSIS — Z950 Presence of cardiac pacemaker: Secondary | ICD-10-CM

## 2011-09-22 LAB — PACEMAKER DEVICE OBSERVATION
BRDY-0004RV: 120 {beats}/min
BRDY-0005RV: 60 {beats}/min
RV LEAD THRESHOLD: 1 V

## 2011-09-22 NOTE — Assessment & Plan Note (Signed)
Permanent. Rate control is adequate. No anticoagulation as noted above

## 2011-09-22 NOTE — Assessment & Plan Note (Signed)
There is evidence of venous insufficiency as well as peripheral edema. There is not a great deal of other volume overload. We will see what his current dose of diuretics. He is also struggling with urinating. Will have him follow up with his PCP

## 2011-09-22 NOTE — Progress Notes (Signed)
HPI  John [name] is a 78 y.o. male Seen in followup for atrial fibrillation which is permanent status post AV junction ablation with resumption of antegrade conduction. He status post pacemaker implantation. There has been significant interval improvement in his nonischemic myopathy with EF  going from 30%->>55%.  Has history of subdural hematoma; hence he does not take Coumadin  Last cath 01/2008 demonstrated luminal irregs. Echo in 04/2009: EF 50-55%, mod LVH, mild AI, mild MVP of post leaflet with mod MR, severe BAE and PASP 45.   Interval history has been notable for a number of falls. He has had problems with fluid accumulation in his legs. He is struggling to urinate.     Past Medical History  Diagnosis Date  . Permanent atrial fibrillation     NOT CANDIDATE FOR COUMADIN  . Bradycardia     AV JUNCTION ABLATION W/RESUMPTION ON CONDUCTION S/P StJUDE PACEMAKER-2011 REVISION/St.JUDE ACCENT SR  . Hypertension   . Subdural hematoma     WHILE ON COUMADIN  . Colitis     Past Surgical History  Procedure Date  . Subdural hematoma evacuation via craniotomy   . Cystectomy     BREAST, NECK  . Hand surgery   . Insert / replace / remove pacemaker     PACEMAKER IMPLANT 2003-2010.Marland KitchenMarland KitchenREVISION St, JUDE ACCENT SR    Current Outpatient Prescriptions  Medication Sig Dispense Refill  . aspirin 81 MG EC tablet Take 81 mg by mouth daily.        Marland Kitchen CALCIUM-VITAMIN D PO Take by mouth daily. Calcium 1200, vit d 1000       . carvedilol (COREG) 25 MG tablet Take 25 mg by mouth 2 (two) times daily with a meal.        . diclofenac sodium (VOLTAREN) 1 % GEL Apply 1 application topically as needed.        . enalapril (VASOTEC) 10 MG tablet Take 10 mg by mouth daily.        . ergocalciferol (VITAMIN D2) 50000 UNITS capsule Take 50,000 Units by mouth once a week. On Sunday.      . escitalopram (LEXAPRO) 10 MG tablet Take 10 mg by mouth daily.        . fish oil-omega-3 fatty acids 1000 MG capsule Take  1 g by mouth daily.       . furosemide (LASIX) 40 MG tablet TAKE 2 TABLETS BY MOUTH TWICE DAILY  120 tablet  2  . HYDROcodone-acetaminophen (LORTAB) 7.5-500 MG per tablet Take 1 tablet by mouth every 6 (six) hours as needed.      . Multiple Vitamin (MULTIVITAMIN) tablet Take 1 tablet by mouth daily.        . Multiple Vitamins-Minerals (B-PLEX PLUS) TABS Take 1 tablet by mouth 2 (two) times daily.        . potassium chloride SA (K-DUR,KLOR-CON) 20 MEQ tablet Take 1 tablet (20 mEq total) by mouth 2 (two) times daily. TAKE AS DIRECTED.  60 tablet  5  . HYDROcodone-acetaminophen (NORCO) 5-325 MG per tablet Take 1 tablet by mouth every 4 (four) hours as needed. For pain        Allergies  Allergen Reactions  . Celecoxib     REACTION: unspecified  . Warfarin Sodium     Review of Systems negative except from HPI and PMH  Physical Exam BP 150/88  Pulse 81  Ht 5\' 8"  (1.727 m)  Wt 198 lb (89.812 kg)  BMI 30.11 kg/m2 Well developed and  well nourished in no acute distress HENT normal E scleral and icterus clear Neck Supple JVP flat; carotids brisk and full Clear to ausculation 2/6 systolic murmur with an irregularly irregular rhythm Soft with active bowel sounds No clubbing cyanosis 3+ and pitting Edema Alert and oriented, grossly normal motor and sensory function Skin Warm and Dry   Assessment and  Plan

## 2011-09-22 NOTE — Patient Instructions (Addendum)

## 2011-09-22 NOTE — Assessment & Plan Note (Signed)
The patient's device was interrogated.  The information was reviewed. No changes were made in the programming.    

## 2011-12-26 ENCOUNTER — Ambulatory Visit (INDEPENDENT_AMBULATORY_CARE_PROVIDER_SITE_OTHER): Payer: Medicare Other | Admitting: *Deleted

## 2011-12-26 ENCOUNTER — Encounter: Payer: Self-pay | Admitting: Internal Medicine

## 2011-12-26 DIAGNOSIS — I442 Atrioventricular block, complete: Secondary | ICD-10-CM

## 2011-12-29 LAB — REMOTE PACEMAKER DEVICE
BMOD-0002RV: 12
BRDY-0004RV: 120 {beats}/min
DEVICE MODEL PM: 2309283
RV LEAD IMPEDENCE PM: 27 Ohm
VENTRICULAR PACING PM: 27

## 2012-01-03 ENCOUNTER — Inpatient Hospital Stay (HOSPITAL_COMMUNITY)
Admission: EM | Admit: 2012-01-03 | Discharge: 2012-01-06 | DRG: 492 | Disposition: A | Discharge status: Skilled Nursing Facility | Payer: Medicare Other | Attending: Internal Medicine | Admitting: Internal Medicine

## 2012-01-03 ENCOUNTER — Emergency Department (HOSPITAL_COMMUNITY): Payer: Medicare Other

## 2012-01-03 ENCOUNTER — Encounter (HOSPITAL_COMMUNITY): Payer: Self-pay | Admitting: Emergency Medicine

## 2012-01-03 DIAGNOSIS — Z950 Presence of cardiac pacemaker: Secondary | ICD-10-CM

## 2012-01-03 DIAGNOSIS — J189 Pneumonia, unspecified organism: Secondary | ICD-10-CM | POA: Diagnosis present

## 2012-01-03 DIAGNOSIS — I509 Heart failure, unspecified: Secondary | ICD-10-CM | POA: Diagnosis present

## 2012-01-03 DIAGNOSIS — M1710 Unilateral primary osteoarthritis, unspecified knee: Secondary | ICD-10-CM | POA: Diagnosis present

## 2012-01-03 DIAGNOSIS — E876 Hypokalemia: Secondary | ICD-10-CM | POA: Diagnosis not present

## 2012-01-03 DIAGNOSIS — G40909 Epilepsy, unspecified, not intractable, without status epilepticus: Secondary | ICD-10-CM | POA: Diagnosis present

## 2012-01-03 DIAGNOSIS — S82853A Displaced trimalleolar fracture of unspecified lower leg, initial encounter for closed fracture: Principal | ICD-10-CM | POA: Diagnosis present

## 2012-01-03 DIAGNOSIS — I10 Essential (primary) hypertension: Secondary | ICD-10-CM | POA: Diagnosis present

## 2012-01-03 DIAGNOSIS — I4891 Unspecified atrial fibrillation: Secondary | ICD-10-CM | POA: Diagnosis present

## 2012-01-03 DIAGNOSIS — W010XXA Fall on same level from slipping, tripping and stumbling without subsequent striking against object, initial encounter: Secondary | ICD-10-CM | POA: Diagnosis present

## 2012-01-03 DIAGNOSIS — I5032 Chronic diastolic (congestive) heart failure: Secondary | ICD-10-CM | POA: Diagnosis present

## 2012-01-03 DIAGNOSIS — Y92009 Unspecified place in unspecified non-institutional (private) residence as the place of occurrence of the external cause: Secondary | ICD-10-CM

## 2012-01-03 DIAGNOSIS — I428 Other cardiomyopathies: Secondary | ICD-10-CM | POA: Diagnosis present

## 2012-01-03 DIAGNOSIS — S82891A Other fracture of right lower leg, initial encounter for closed fracture: Secondary | ICD-10-CM | POA: Diagnosis present

## 2012-01-03 HISTORY — DX: Nonrheumatic mitral (valve) insufficiency: I34.0

## 2012-01-03 HISTORY — DX: Unspecified atrial fibrillation: I48.91

## 2012-01-03 HISTORY — DX: Depression, unspecified: F32.A

## 2012-01-03 HISTORY — DX: Major depressive disorder, single episode, unspecified: F32.9

## 2012-01-03 HISTORY — DX: Unspecified osteoarthritis, unspecified site: M19.90

## 2012-01-03 HISTORY — DX: Cardiomyopathy, unspecified: I42.9

## 2012-01-03 HISTORY — DX: Heart failure, unspecified: I50.9

## 2012-01-03 HISTORY — DX: Vitamin D deficiency, unspecified: E55.9

## 2012-01-03 LAB — CBC WITH DIFFERENTIAL/PLATELET
Basophils Relative: 0 % (ref 0–1)
HCT: 31.2 % — ABNORMAL LOW (ref 39.0–52.0)
Hemoglobin: 10.7 g/dL — ABNORMAL LOW (ref 13.0–17.0)
Lymphs Abs: 1.2 10*3/uL (ref 0.7–4.0)
MCHC: 34.3 g/dL (ref 30.0–36.0)
Monocytes Absolute: 1.3 10*3/uL — ABNORMAL HIGH (ref 0.1–1.0)
Monocytes Relative: 14 % — ABNORMAL HIGH (ref 3–12)
Neutro Abs: 6.8 10*3/uL (ref 1.7–7.7)
Neutrophils Relative %: 72 % (ref 43–77)
RBC: 3.5 MIL/uL — ABNORMAL LOW (ref 4.22–5.81)

## 2012-01-03 LAB — BASIC METABOLIC PANEL
BUN: 14 mg/dL (ref 6–23)
CO2: 30 mEq/L (ref 19–32)
Chloride: 97 mEq/L (ref 96–112)
Creatinine, Ser: 0.72 mg/dL (ref 0.50–1.35)
Glucose, Bld: 91 mg/dL (ref 70–99)
Potassium: 3.1 mEq/L — ABNORMAL LOW (ref 3.5–5.1)

## 2012-01-03 MED ORDER — HYDROCODONE-ACETAMINOPHEN 5-325 MG PO TABS
1.0000 | ORAL_TABLET | Freq: Four times a day (QID) | ORAL | Status: DC | PRN
  Administered 2012-01-03 – 2012-01-06 (×6): 1 via ORAL
  Filled 2012-01-03 (×6): qty 1

## 2012-01-03 MED ORDER — ESCITALOPRAM OXALATE 10 MG PO TABS
10.0000 mg | ORAL_TABLET | Freq: Every day | ORAL | Status: DC
  Administered 2012-01-03 – 2012-01-06 (×3): 10 mg via ORAL
  Filled 2012-01-03 (×4): qty 1

## 2012-01-03 MED ORDER — ASPIRIN EC 81 MG PO TBEC
81.0000 mg | DELAYED_RELEASE_TABLET | Freq: Every day | ORAL | Status: DC
  Administered 2012-01-03 – 2012-01-06 (×3): 81 mg via ORAL
  Filled 2012-01-03 (×4): qty 1

## 2012-01-03 MED ORDER — FUROSEMIDE 40 MG PO TABS
40.0000 mg | ORAL_TABLET | Freq: Every day | ORAL | Status: DC
  Administered 2012-01-03 – 2012-01-06 (×3): 40 mg via ORAL
  Filled 2012-01-03 (×4): qty 1

## 2012-01-03 MED ORDER — MORPHINE SULFATE 4 MG/ML IJ SOLN
4.0000 mg | Freq: Once | INTRAMUSCULAR | Status: AC
  Administered 2012-01-03: 4 mg via INTRAVENOUS
  Filled 2012-01-03: qty 1

## 2012-01-03 MED ORDER — DEXTROSE 5 % IV SOLN
1.0000 g | INTRAVENOUS | Status: DC
  Administered 2012-01-04 – 2012-01-06 (×3): 1 g via INTRAVENOUS
  Filled 2012-01-03 (×3): qty 10

## 2012-01-03 MED ORDER — VITAMIN D (ERGOCALCIFEROL) 1.25 MG (50000 UNIT) PO CAPS
50000.0000 [IU] | ORAL_CAPSULE | ORAL | Status: DC
  Filled 2012-01-03: qty 1

## 2012-01-03 MED ORDER — ONDANSETRON HCL 4 MG PO TABS: 4.0000 mg | ORAL_TABLET | Freq: Four times a day (QID) | ORAL | Status: DC | PRN

## 2012-01-03 MED ORDER — SODIUM CHLORIDE 0.9 % IV SOLN
Freq: Once | INTRAVENOUS | Status: AC
  Administered 2012-01-03: 20 mL/h via INTRAVENOUS

## 2012-01-03 MED ORDER — SODIUM CHLORIDE 0.9 % IJ SOLN
3.0000 mL | Freq: Two times a day (BID) | INTRAMUSCULAR | Status: DC
  Administered 2012-01-03 – 2012-01-06 (×4): 3 mL via INTRAVENOUS

## 2012-01-03 MED ORDER — RISAQUAD PO CAPS
1.0000 | ORAL_CAPSULE | Freq: Every day | ORAL | Status: DC
  Administered 2012-01-03 – 2012-01-06 (×3): 1 via ORAL
  Filled 2012-01-03 (×4): qty 1

## 2012-01-03 MED ORDER — ENOXAPARIN SODIUM 40 MG/0.4ML ~~LOC~~ SOLN
40.0000 mg | SUBCUTANEOUS | Status: DC
  Administered 2012-01-03: 40 mg via SUBCUTANEOUS
  Filled 2012-01-03 (×2): qty 0.4

## 2012-01-03 MED ORDER — FUROSEMIDE 40 MG PO TABS
40.0000 mg | ORAL_TABLET | Freq: Two times a day (BID) | ORAL | Status: DC
  Filled 2012-01-03 (×2): qty 1

## 2012-01-03 MED ORDER — MORPHINE SULFATE 2 MG/ML IJ SOLN: 2.0000 mg | INTRAMUSCULAR | Status: DC | PRN

## 2012-01-03 MED ORDER — CARVEDILOL 25 MG PO TABS
25.0000 mg | ORAL_TABLET | Freq: Two times a day (BID) | ORAL | Status: DC
  Administered 2012-01-03 – 2012-01-06 (×5): 25 mg via ORAL
  Filled 2012-01-03 (×8): qty 1

## 2012-01-03 MED ORDER — ACETAMINOPHEN 650 MG RE SUPP: 650.0000 mg | Freq: Four times a day (QID) | RECTAL | Status: DC | PRN

## 2012-01-03 MED ORDER — ENALAPRIL MALEATE 10 MG PO TABS
10.0000 mg | ORAL_TABLET | Freq: Every day | ORAL | Status: DC
  Administered 2012-01-03 – 2012-01-06 (×3): 10 mg via ORAL
  Filled 2012-01-03 (×4): qty 1

## 2012-01-03 MED ORDER — DEXTROSE 5 % IV SOLN
500.0000 mg | Freq: Once | INTRAVENOUS | Status: AC
  Administered 2012-01-03: 500 mg via INTRAVENOUS
  Filled 2012-01-03: qty 500

## 2012-01-03 MED ORDER — DICLOFENAC SODIUM 1 % TD GEL
1.0000 "application " | Freq: Two times a day (BID) | TRANSDERMAL | Status: DC | PRN
  Filled 2012-01-03: qty 100

## 2012-01-03 MED ORDER — POTASSIUM CHLORIDE CRYS ER 20 MEQ PO TBCR
20.0000 meq | EXTENDED_RELEASE_TABLET | Freq: Every day | ORAL | Status: DC
  Administered 2012-01-05: 20 meq via ORAL
  Filled 2012-01-03 (×4): qty 1

## 2012-01-03 MED ORDER — ACETAMINOPHEN 325 MG PO TABS: 650.0000 mg | ORAL_TABLET | Freq: Four times a day (QID) | ORAL | Status: DC | PRN

## 2012-01-03 MED ORDER — POTASSIUM CHLORIDE CRYS ER 20 MEQ PO TBCR
30.0000 meq | EXTENDED_RELEASE_TABLET | Freq: Once | ORAL | Status: AC
  Administered 2012-01-03: 30 meq via ORAL
  Filled 2012-01-03: qty 1

## 2012-01-03 MED ORDER — ONDANSETRON HCL 4 MG/2ML IJ SOLN: 4.0000 mg | Freq: Four times a day (QID) | INTRAMUSCULAR | Status: DC | PRN

## 2012-01-03 MED ORDER — DEXTROSE 5 % IV SOLN
1.0000 g | Freq: Once | INTRAVENOUS | Status: AC
  Administered 2012-01-03: 1 g via INTRAVENOUS
  Filled 2012-01-03: qty 10

## 2012-01-03 NOTE — ED Notes (Signed)
Per EMS: Pt was ambulating last night with his son at home.  Pt tripped and "slid down" (according to family).  Pt c/o rt ankle pain and swelling.  Ankle is splinted.

## 2012-01-03 NOTE — ED Provider Notes (Addendum)
History     CSN: [id number]  Arrival date & time 01/03/12  1055   First MD Initiated Contact with Patient 01/03/12 1104      Chief Complaint  Patient presents with  . Fall    (Consider location/radiation/quality/duration/timing/severity/associated sxs/prior treatment) Patient is a 78 y.o. male presenting with fall. The history is provided by the patient and the spouse.  Fall Incident onset: 11pm last night. The fall occurred while walking (wife was helping him to the bathroom and his feet got tangled in the sheets causing him to fall with right ankle deformity). He fell from a height of 1 to 2 ft. He landed on carpet. There was no blood loss. Point of impact: right ankle. Pain location: right ankle. The pain is at a severity of 9/10. The pain is severe. He was not ambulatory at the scene. Associated symptoms comments: No other injury from the fall or complaints. The symptoms are aggravated by activity, standing and use of the injured limb. He has tried immobilization for the symptoms. The treatment provided mild relief.    Past Medical History  Diagnosis Date  . Permanent atrial fibrillation     NOT CANDIDATE FOR COUMADIN  . Bradycardia     AV JUNCTION ABLATION W/RESUMPTION ON CONDUCTION S/P StJUDE PACEMAKER-2011 REVISION/St.JUDE ACCENT SR  . Hypertension   . Subdural hematoma     WHILE ON COUMADIN  . Colitis     Past Surgical History  Procedure Date  . Subdural hematoma evacuation via craniotomy   . Cystectomy     BREAST, NECK  . Hand surgery   . Insert / replace / remove pacemaker     PACEMAKER IMPLANT 2003-2010.Marland KitchenMarland KitchenREVISION St, JUDE ACCENT SR    History reviewed. No pertinent family history.  History  Substance Use Topics  . Smoking status: Former Smoker    Types: Cigarettes  . Smokeless tobacco: Not on file  . Alcohol Use: No      Review of Systems  Respiratory: Positive for cough. Negative for shortness of breath.   Cardiovascular: Positive for leg swelling.   All other systems reviewed and are negative.    Allergies  Celecoxib and Warfarin sodium  Home Medications   Current Outpatient Rx  Name Route Sig Dispense Refill  . ASPIRIN 81 MG PO TBEC Oral Take 81 mg by mouth daily.      Marland Kitchen CALCIUM-VITAMIN D PO Oral Take by mouth daily. Calcium 1200, vit d 1000     . CARVEDILOL 25 MG PO TABS Oral Take 25 mg by mouth 2 (two) times daily with a meal.      . DICLOFENAC SODIUM 1 % TD GEL Topical Apply 1 application topically as needed.      . ENALAPRIL MALEATE 10 MG PO TABS Oral Take 10 mg by mouth daily.      . ERGOCALCIFEROL 50000 UNITS PO CAPS Oral Take 50,000 Units by mouth once a week. On Sunday.    Marland Kitchen ESCITALOPRAM OXALATE 10 MG PO TABS Oral Take 10 mg by mouth daily.      . OMEGA-3 FATTY ACIDS 1000 MG PO CAPS Oral Take 1 g by mouth daily.     . FUROSEMIDE 40 MG PO TABS  TAKE 2 TABLETS BY MOUTH TWICE DAILY 120 tablet 2  . HYDROCODONE-ACETAMINOPHEN 7.5-500 MG PO TABS Oral Take 1 tablet by mouth every 6 (six) hours as needed.    Marland Kitchen HYDROCODONE-ACETAMINOPHEN 5-325 MG PO TABS Oral Take 1 tablet by mouth every 4 (four) hours  as needed. For pain    . ONE-DAILY MULTI VITAMINS PO TABS Oral Take 1 tablet by mouth daily.      . B-PLEX PLUS PO TABS Oral Take 1 tablet by mouth 2 (two) times daily.      Marland Kitchen POTASSIUM CHLORIDE CRYS ER 20 MEQ PO TBCR Oral Take 1 tablet (20 mEq total) by mouth 2 (two) times daily. TAKE AS DIRECTED. 60 tablet 5    BP 135/65  Pulse 67  Temp 97.8 F (36.6 C) (Oral)  Resp 17  SpO2 94%  Physical Exam  Nursing note and vitals reviewed. Constitutional: He is oriented to person, place, and time. He appears well-developed and well-nourished. No distress.  HENT:  Head: Normocephalic and atraumatic.  Mouth/Throat: Oropharynx is clear and moist.  Eyes: Conjunctivae and EOM are normal. Pupils are equal, round, and reactive to light.  Neck: Normal range of motion. Neck supple.  Cardiovascular: Normal rate and intact distal pulses.  An  irregularly irregular rhythm present.  No murmur heard. Pulmonary/Chest: Effort normal and breath sounds normal. No respiratory distress. He has no wheezes. He has no rales.  Abdominal: Soft. He exhibits no distension. There is no tenderness. There is no rebound and no guarding.  Musculoskeletal: He exhibits edema and tenderness.       Right ankle: He exhibits decreased range of motion, swelling and deformity. He exhibits no laceration and normal pulse. tenderness. Lateral malleolus and medial malleolus tenderness found.       2+ pitting edema to the lower ext  Neurological: He is alert and oriented to person, place, and time.  Skin: Skin is warm and dry. No rash noted. No erythema.  Psychiatric: He has a normal mood and affect. His behavior is normal.    ED Course  Procedures (including critical care time)  Labs Reviewed  CBC WITH DIFFERENTIAL - Abnormal; Notable for the following:    RBC 3.50 (*)     Hemoglobin 10.7 (*)     HCT 31.2 (*)     Monocytes Relative 14 (*)     Monocytes Absolute 1.3 (*)     All other components within normal limits  BASIC METABOLIC PANEL - Abnormal; Notable for the following:    Sodium 133 (*)     Potassium 3.1 (*)     GFR calc non Af Amer 82 (*)     All other components within normal limits  PRO B NATRIURETIC PEPTIDE - Abnormal; Notable for the following:    Pro B Natriuretic peptide (BNP) 3931.0 (*)     All other components within normal limits   Dg Chest 2 View  01/03/2012  *RADIOLOGY REPORT*  Clinical Data: Cough and shortness of breath.  CHEST - 2 VIEW  Comparison: Chest x-ray 08/05/2011.  Findings: Lung volumes are low.  Right lower lobe airspace consolidation concerning for pneumonia.  Small bilateral pleural effusions.  Pulmonary venous congestion without frank pulmonary edema.  Mild enlargement of the cardiopericardial silhouette. Tortuosity and atherosclerosis of the thoracic aorta.  A left-sided pacemaker device in place with lead tips  projecting over the expected location of the right atrium and right ventricular apex.  IMPRESSION: 1.  Findings concerning for right lower lobe pneumonia. 2.  Small bilateral pleural effusions. 3.  Mild cardiomegaly with pulmonary venous congestion, but no frank pulmonary edema.  4.  Atherosclerosis.  Original Report Authenticated By: Florencia Reasons, M.D.   Dg Ankle Complete Right  01/03/2012  *RADIOLOGY REPORT*  Clinical Data: History of fall  complaining of ankle pain.  RIGHT ANKLE - COMPLETE 3+ VIEW  Comparison: No priors.  Findings: There is a trimalleolar fracture of the right ankle. There is medial angulation of both the distal fibular fracture and the fracture through the medial malleolus.  Mild posterior displacement of the fracture of the posterior aspect of the distal tibia is noted.  Extensive overlying soft tissue swelling. Visualized portions of the hindfoot and midfoot appear intact.  IMPRESSION: 1.  Trimalleolar fracture of the right ankle, as detailed above.  Original Report Authenticated By: Florencia Reasons, M.D.    Date: 01/03/2012  Rate: 93  Rhythm: atrial fibrillation  QRS Axis: normal  Intervals: normal  ST/T Wave abnormalities: nonspecific T wave changes  Conduction Disutrbances:none  Narrative Interpretation:   Old EKG Reviewed: unchanged    1. CHF (congestive heart failure)   2. CAP (community acquired pneumonia)   3. Trimalleolar fracture       MDM   Patient with multiple medical problems and gait issues, had a mechanical fall at about 11 PM last night due to feet getting tangled in his sheets. Since that time he's been unable to ambulate. Patient has a deformity of his right ankle consistent with a fracture. Patient is not going to be able to be safely cared for at home do to inability to ambulate but only using 1 foot.  Discussed with the wife and his primary physician and we'll admit for pain control and rehabilitation placement.  11:56 AM Pt recently  has also had a new cough and possibly worsening ankle swelling and SOB.  Ankle film shows trimal fx and discussed with [name] and will need surgery. CBC, BMP, BNP, EKG and CXR pending.  12:40 PM Elevated BNP to >3000 and signs of CAP with RLL PNA.  Started on rocephin and azithro and will be admitted.   Gwyneth Sprout, MD 01/03/12 1241  Gwyneth Sprout, MD 01/03/12 1326

## 2012-01-03 NOTE — H&P (Addendum)
John [name] is an 78 y.o. male.   Chief Complaint: fall HPI: John [name] is a pleasant gentleman with several medical problems.   He has terrible knee arthritis but has been felt a good surgical candidate.  At 11 pm last night.  His wife helped him off the toilet and tried to get to bedroom.  His leg gave way and he collapsed in the hallway.  A strong son lives with him and picked him up and got him to the bed.  Today they presented to the ER and has a bad right ankle fracture and he needs admission and care. No chest pains now or ever.  Eating and drinking okay.  Appetite not great lately.   Bowel and bladder okay.  No blood except occas. Rare nosebleed. He has had some sob.  With exhalation he seems to have exaggerated effort. He is sob with any exertion for 2 weeks or more.   January 2013 he was walking with a walker and could get up by himself.   He then had three falls with no fractures but he was banged up.  He has been in recliner since then really.    It takes total assistance to get him up for the last 6 months.   Past Medical History  Diagnosis Date  . Permanent atrial fibrillation     NOT CANDIDATE FOR COUMADIN  . Bradycardia     AV JUNCTION ABLATION W/RESUMPTION ON CONDUCTION S/P StJUDE PACEMAKER-2011 REVISION/St.JUDE ACCENT SR-on his second pacemaker as of 2010-[name].  Marland Kitchen Hypertension   . Subdural hematoma     WHILE ON COUMADIN-2003 and not on coumadin since then  . Colitis Chronic leg edema Spinal stenosis Peripheral neuropathy Gait instability Knee oa One isolated seizure from one dose of 5 mg zaroxolyn.  Just one dose and had this reaction.     Past Surgical History  Procedure Date  . Subdural hematoma evacuation via craniotomy   . Cystectomy     BREAST, NECK  . Hand surgery   . Insert / replace / remove pacemaker     PACEMAKER IMPLANT 2003-2010.Marland KitchenMarland KitchenREVISION St, JUDE ACCENT SR    History reviewed. No pertinent family history. Social History:  John [name], wife ,  married 1955.  reports that he has quit smoking. His smoking use included Cigarettes. Quit 35 yrs ago. He does not have any smokeless tobacco history on file. Gin and tonic one a day.no drugs.  Retired from Airline pilot.  Manufactures representative.  Allergies:  Allergies  Allergen Reactions  . Celecoxib Other (See Comments)    Stomach bleed  . Warfarin Sodium     Causes heart rate, pace maker   Zaroxolyn-seizure  Meds:  Hydrocodone/apap 7.5/325  One every 8 hours usually lexapro 10 mg daily [id number] meq one daily  (but only on 1/2 pill once a day for the last 3 weeks) Furosemide 40 mg one daily (this is a reduction to this from the last 3 weeks.) L-methylfolate neuropath b one bid Enalapril 10 mg daily Coreg 25 mg bid Vit d 50000 iu once a week Asa 81 mg daily Calcium with d one daily voltaren gel 1%  Apply to knee but not regularly  , maybe bid usually, to right knee omega3 bid mvi daily Magnesium 400mg  but just sometimes slo-niacin 500 mg one daily Alpha-lipoic  200 mg daily  Results for orders placed during the hospital encounter of 01/03/12 (from the past 48 hour(s))  CBC WITH DIFFERENTIAL     Status: Abnormal  Collection Time   01/03/12 11:50 AM      Component Value Range Comment   WBC 9.4  4.0 - 10.5 K/uL    RBC 3.50 (*) 4.22 - 5.81 MIL/uL    Hemoglobin 10.7 (*) 13.0 - 17.0 g/dL    HCT 47.8 (*) 29.5 - 52.0 %    MCV 89.1  78.0 - 100.0 fL    MCH 30.6  26.0 - 34.0 pg    MCHC 34.3  30.0 - 36.0 g/dL    RDW 62.1  30.8 - 65.7 %    Platelets 207  150 - 400 K/uL    Neutrophils Relative 72  43 - 77 %    Neutro Abs 6.8  1.7 - 7.7 K/uL    Lymphocytes Relative 12  12 - 46 %    Lymphs Abs 1.2  0.7 - 4.0 K/uL    Monocytes Relative 14 (*) 3 - 12 %    Monocytes Absolute 1.3 (*) 0.1 - 1.0 K/uL    Eosinophils Relative 2  0 - 5 %    Eosinophils Absolute 0.1  0.0 - 0.7 K/uL    Basophils Relative 0  0 - 1 %    Basophils Absolute 0.0  0.0 - 0.1 K/uL     BMET    Component Value  Date/Time   NA 133* 01/03/2012 1150   K 3.1* 01/03/2012 1150   CL 97 01/03/2012 1150   CO2 30 01/03/2012 1150   GLUCOSE 91 01/03/2012 1150   BUN 14 01/03/2012 1150   CREATININE 0.72 01/03/2012 1150   CREATININE 0.99 09/27/2010 1542   CALCIUM 8.6 01/03/2012 1150   GFRNONAA 82* 01/03/2012 1150   GFRAA >90 01/03/2012 1150      Dg Ankle Complete Right  01/03/2012  *RADIOLOGY REPORT*  Clinical Data: History of fall complaining of ankle pain.  RIGHT ANKLE - COMPLETE 3+ VIEW  Comparison: No priors.  Findings: There is a trimalleolar fracture of the right ankle. There is medial angulation of both the distal fibular fracture and the fracture through the medial malleolus.  Mild posterior displacement of the fracture of the posterior aspect of the distal tibia is noted.  Extensive overlying soft tissue swelling. Visualized portions of the hindfoot and midfoot appear intact.  IMPRESSION: 1.  Trimalleolar fracture of the right ankle, as detailed above.  Original Report Authenticated By: Florencia Reasons, M.D.    ROS: as per hpi.  Had symvisc in both knees last week from dr. Otelia Sergeant  Blood pressure 135/65, pulse 67, temperature 97.8 F (36.6 C), temperature source Oral, resp. rate 17, SpO2 94.00%.  He is alert and oriented to person and place but had to reminded why he was in ER.  Mild pallor.  No jvd.  Heart irreg and irreg and normal rate. 2/6 sem left and right sternal border. Lungs cta anterolaterally bilaterally.   No w/r/r.  One plus leg edema. Right ankle is more swollen and splinted currently.   Assessment/Plan Patient Active Problem List  Diagnosis  . CARDIOMYOPATHY, PRIMARY, DILATED-i cannot find details of this in our office chart.   A bnp is pending. He has had a bit of increased sob in last few weeks but this coincides with a reduction in his lasix dosing that was attempted due to frequent urination. He may need increased dosing of lasix.   He has the right ankle fracture and I do believe it will  need to be stabilized surgically. He will be at increased peri-operative risk  no matter what but leaving it as it is is not a good option due to need for pain control, and future chance of mobility.  I would like cards opinion as well before going to surgery.  Replace potassium  . AV BLOCK, COMPLETE-has pacemaker  . ATRIAL FIBRILLATION-not an anticoagulation candidate due to falls and past subdural hematomas. i will  Place him on dvt prophylactic dose lovenox however.  Marland Kitchen DIASTOLIC HEART FAILURE, CHRONIC-follow  . Pacemaker  [place]  . MITRAL REGURGITATION  . FATIGUE  . Ankle fracture, right-per ortho.  . Seizure disorder-this seemed to be an isolated event due to dosing of zaroxolyn.  Avoid that medicine in the future.  . Arthritis of knee-prn pain meds. Right Lower Lobe pneumonia.  He will get one dose of azithro and rocephin and then we will continue daily rocephin and probiotic as well. He desires full code status.     Ezequiel Kayser, MD 01/03/2012, 12:25 PM

## 2012-01-03 NOTE — ED Notes (Signed)
GNF:[id number]<YQ> Expected date:01/03/12<BR> Expected time:10:49 AM<BR> Means of arrival:Ambulance<BR> Comments:<BR> Fall

## 2012-01-03 NOTE — ED Notes (Signed)
Cardiologist at bedside.  

## 2012-01-03 NOTE — Consult Note (Signed)
CARDIOLOGY CONSULT NOTE  Patient ID: John [name] MRN: [id number] DOB/AGE: 1923-06-17 78 y.o.  Admit date: 01/03/2012 Primary Physician Minda Meo, MD Primary Cardiologist [name] Chief Complaint  Dyspnea  HPI: The patient presents with a broken right ankle after a fall.  He has a history of atrial fibrillation, AV ablation (with resumption of conduction) and pacemaker.  He has had a nonischemic cardiomyopathy although his most recent EF was low normal.    At home he is very limited in his activities because of balance problems and knee. He gets around with a walker and with assistance. He does have some increasing dyspnea with exertion that has been mild. He sleeps in a hospital bed with his head elevated at 30. This is not new. He does have some lower extremity swelling and wears compression stockings and keep his feet elevated. This has been controlled this way. He does not notice his atrial fibrillation. He hasn't had any presyncope or syncope. He denies any chest pressure, neck or arm discomfort.    Past Medical History  Diagnosis Date  . Permanent atrial fibrillation     NOT CANDIDATE FOR COUMADIN  . Bradycardia     AV JUNCTION ABLATION W/RESUMPTION ON CONDUCTION S/P StJUDE PACEMAKER-2011 REVISION/St.JUDE ACCENT SR  . Hypertension   . Subdural hematoma     WHILE ON COUMADIN  . Colitis     Past Surgical History  Procedure Date  . Subdural hematoma evacuation via craniotomy   . Cystectomy     BREAST, NECK  . Hand surgery   . Insert / replace / remove pacemaker     PACEMAKER IMPLANT 2003-2010.Marland KitchenMarland KitchenREVISION St, JUDE ACCENT SR    Allergies  Allergen Reactions  . Celecoxib Other (See Comments)    Stomach bleed  . Warfarin Sodium     Causes heart rate, pace maker  . Zaroxolyn (Metolazone)     seizure    (Not in a hospital admission) History reviewed. No pertinent family history.  History   Social History  . Marital Status: Married    Spouse Name: N/A   Number of Children: N/A  . Years of Education: N/A   Occupational History  . Not on file.   Social History Main Topics  . Smoking status: Former Smoker    Types: Cigarettes  . Smokeless tobacco: Not on file  . Alcohol Use: No  . Drug Use: No  . Sexually Active: Not on file   Other Topics Concern  . Not on file   Social History Narrative   MARRIEDCHILDREN     ROS:  Hot flashes and diaphoresis.  Otherwise as stated in the HPI and negative for all other systems.  Physical Exam: Blood pressure 135/65, pulse 67, temperature 97.8 F (36.6 C), temperature source Oral, resp. rate 17, SpO2 94.00%.  GENERAL:  Frail appearing HEENT:  Pupils equal round and reactive, fundi not visualized, oral mucosa unremarkable, poor dentition NECK:  No jugular venous distention, waveform within normal limits, carotid upstroke brisk and symmetric, no bruits, no thyromegaly LYMPHATICS:  No cervical, inguinal adenopathy LUNGS:  Clear to auscultation bilaterally BACK:  No CVA tenderness CHEST:  Unremarkable, pacemaker pocket HEART:  PMI not displaced or sustained,S1 and S2 within normal limits, no S3, no clicks, no rubs, apical 3/6 holosystolic murmur, slowed murmurs ABD:  Flat, positive bowel sounds normal in frequency in pitch, no bruits, no rebound, no guarding, no midline pulsatile mass, no hepatomegaly, no splenomegaly EXT:  2 plus pulses throughout, mild edema, no  cyanosis no clubbing, right foot bandaged.   SKIN:  No rashes no nodules NEURO:  Cranial nerves II through XII grossly intact, motor grossly intact throughout PSYCH:  Cognitively intact, oriented to person place and time  Labs: Lab Results  Component Value Date   BUN 14 01/03/2012   Lab Results  Component Value Date   CREATININE 0.72 01/03/2012   Lab Results  Component Value Date   NA 133* 01/03/2012   K 3.1* 01/03/2012   CL 97 01/03/2012   CO2 30 01/03/2012   Lab Results  Component Value Date   TROPONINI <0.30 08/04/2011   Lab  Results  Component Value Date   WBC 9.4 01/03/2012   HGB 10.7* 01/03/2012   HCT 31.2* 01/03/2012   MCV 89.1 01/03/2012   PLT 207 01/03/2012    Radiology: CXR  1. Findings concerning for right lower lobe pneumonia.  2. Small bilateral pleural effusions.  3. Mild cardiomegaly with pulmonary venous congestion, but no  frank pulmonary edema.  4. Atherosclerosis.  WRU:EAVWUJ fibrillation, rate 80s, minimal voltage criteria for LVH by voltage, nonspecific T-wave changes, premature ventricular contractions.  01/03/2012   ASSESSMENT AND PLAN:   CHF:  He has mild mixed diastolic and systolic acute on chronic heart failure. However, he's in no distress.  I think he can continue the medications as listed.  I will order the next 24 hours increase his Lasix slightly. We will need to watch strict intake and output as he would be prone to worsening acute pulmonary edema.  Atrial fibrillation:  We'll be widest on telemetry. We will continue his current medications. Further adjustments will be based on his heart rate which will of course go up with pain.  Preop:  The patient has no prohibitive risk to this necessary surgery. He certainly at risk for some cardiovascular complications particularly volume of this will be addressed as above. We will follow closely.   SignedRollene Rotunda 01/03/2012, 2:23 PM

## 2012-01-03 NOTE — ED Notes (Signed)
18 L forearm.  50 mcg fentanyl given en route

## 2012-01-04 ENCOUNTER — Inpatient Hospital Stay (HOSPITAL_COMMUNITY): Payer: Medicare Other

## 2012-01-04 ENCOUNTER — Inpatient Hospital Stay (HOSPITAL_COMMUNITY): Payer: Medicare Other | Admitting: Anesthesiology

## 2012-01-04 ENCOUNTER — Encounter (HOSPITAL_COMMUNITY): Payer: Self-pay | Admitting: Anesthesiology

## 2012-01-04 ENCOUNTER — Encounter (HOSPITAL_COMMUNITY)
Admission: EM | Disposition: A | Discharge status: Skilled Nursing Facility | Payer: Self-pay | Source: Home / Self Care | Attending: Internal Medicine

## 2012-01-04 DIAGNOSIS — J189 Pneumonia, unspecified organism: Secondary | ICD-10-CM | POA: Diagnosis present

## 2012-01-04 HISTORY — PX: ORIF ANKLE FRACTURE: SHX5408

## 2012-01-04 LAB — DIFFERENTIAL
Lymphs Abs: 1.1 10*3/uL (ref 0.7–4.0)
Monocytes Absolute: 1.2 10*3/uL — ABNORMAL HIGH (ref 0.1–1.0)
Monocytes Relative: 15 % — ABNORMAL HIGH (ref 3–12)
Neutro Abs: 5.8 10*3/uL (ref 1.7–7.7)
Neutrophils Relative %: 70 % (ref 43–77)

## 2012-01-04 LAB — COMPREHENSIVE METABOLIC PANEL
AST: 17 U/L (ref 0–37)
CO2: 27 mEq/L (ref 19–32)
Calcium: 8.2 mg/dL — ABNORMAL LOW (ref 8.4–10.5)
Creatinine, Ser: 0.67 mg/dL (ref 0.50–1.35)
GFR calc Af Amer: >90 mL/min (ref >90)
GFR calc non Af Amer: 85 mL/min — ABNORMAL LOW (ref >90)
Sodium: 133 mEq/L — ABNORMAL LOW (ref 135–145)
Total Protein: 5.9 g/dL — ABNORMAL LOW (ref 6.0–8.3)

## 2012-01-04 LAB — CBC
HCT: 29.4 % — ABNORMAL LOW (ref 39.0–52.0)
Hemoglobin: 10 g/dL — ABNORMAL LOW (ref 13.0–17.0)
RBC: 3.34 MIL/uL — ABNORMAL LOW (ref 4.22–5.81)

## 2012-01-04 LAB — SURGICAL PCR SCREEN: Staphylococcus aureus: NEGATIVE

## 2012-01-04 SURGERY — OPEN REDUCTION INTERNAL FIXATION (ORIF) ANKLE FRACTURE
Anesthesia: Spinal | Site: Ankle | Laterality: Right | Wound class: Clean

## 2012-01-04 MED ORDER — 0.9 % SODIUM CHLORIDE (POUR BTL) OPTIME
TOPICAL | Status: DC | PRN
  Administered 2012-01-04: 1000 mL

## 2012-01-04 MED ORDER — BUPIVACAINE HCL (PF) 0.5 % IJ SOLN
INTRAMUSCULAR | Status: DC | PRN
  Administered 2012-01-04: 10 mL

## 2012-01-04 MED ORDER — FENTANYL CITRATE 0.05 MG/ML IJ SOLN
INTRAMUSCULAR | Status: DC | PRN
  Administered 2012-01-04: 50 ug via INTRAVENOUS

## 2012-01-04 MED ORDER — LACTATED RINGERS IV SOLN: INTRAVENOUS | Status: DC

## 2012-01-04 MED ORDER — PROMETHAZINE HCL 25 MG/ML IJ SOLN: 6.2500 mg | INTRAMUSCULAR | Status: DC | PRN

## 2012-01-04 MED ORDER — CEFAZOLIN SODIUM 1-5 GM-% IV SOLN
INTRAVENOUS | Status: DC | PRN
  Administered 2012-01-04: 2 g via INTRAVENOUS

## 2012-01-04 MED ORDER — LACTATED RINGERS IV SOLN
INTRAVENOUS | Status: DC | PRN
  Administered 2012-01-04: 07:00:00 via INTRAVENOUS

## 2012-01-04 MED ORDER — HYDROMORPHONE HCL PF 1 MG/ML IJ SOLN: 0.2500 mg | INTRAMUSCULAR | Status: DC | PRN

## 2012-01-04 MED ORDER — ONDANSETRON HCL 4 MG PO TABS: 4.0000 mg | ORAL_TABLET | Freq: Four times a day (QID) | ORAL | Status: DC | PRN

## 2012-01-04 MED ORDER — ACETAMINOPHEN 10 MG/ML IV SOLN
INTRAVENOUS | Status: DC | PRN
  Administered 2012-01-04: 1000 mg via INTRAVENOUS

## 2012-01-04 MED ORDER — MEPERIDINE HCL 25 MG/ML IJ SOLN
6.2500 mg | INTRAMUSCULAR | Status: DC | PRN
  Filled 2012-01-04: qty 1

## 2012-01-04 MED ORDER — KETAMINE HCL 10 MG/ML IJ SOLN
INTRAMUSCULAR | Status: DC | PRN
  Administered 2012-01-04: 30 mg via INTRAVENOUS

## 2012-01-04 MED ORDER — DOCUSATE SODIUM 100 MG PO CAPS
100.0000 mg | ORAL_CAPSULE | Freq: Two times a day (BID) | ORAL | Status: DC
  Administered 2012-01-04 – 2012-01-06 (×4): 100 mg via ORAL
  Filled 2012-01-04 (×8): qty 1

## 2012-01-04 MED ORDER — BISACODYL 10 MG RE SUPP: 10.0000 mg | Freq: Every day | RECTAL | Status: DC | PRN

## 2012-01-04 MED ORDER — ENOXAPARIN SODIUM 40 MG/0.4ML ~~LOC~~ SOLN
40.0000 mg | Freq: Every day | SUBCUTANEOUS | Status: DC
  Administered 2012-01-05 – 2012-01-06 (×2): 40 mg via SUBCUTANEOUS
  Filled 2012-01-04 (×2): qty 0.4

## 2012-01-04 MED ORDER — BUPIVACAINE IN DEXTROSE 0.75-8.25 % IT SOLN
INTRATHECAL | Status: DC | PRN
  Administered 2012-01-04: 1.75 mL via INTRATHECAL

## 2012-01-04 MED ORDER — PROPOFOL 10 MG/ML IV EMUL
INTRAVENOUS | Status: DC | PRN
  Administered 2012-01-04: 140 ug/kg/min via INTRAVENOUS

## 2012-01-04 MED ORDER — POTASSIUM CHLORIDE CRYS ER 20 MEQ PO TBCR
20.0000 meq | EXTENDED_RELEASE_TABLET | Freq: Once | ORAL | Status: AC
  Administered 2012-01-04: 20 meq via ORAL
  Filled 2012-01-04 (×2): qty 1

## 2012-01-04 MED ORDER — ONDANSETRON HCL 4 MG/2ML IJ SOLN: 4.0000 mg | Freq: Four times a day (QID) | INTRAMUSCULAR | Status: DC | PRN

## 2012-01-04 MED ORDER — POTASSIUM CHLORIDE CRYS ER 20 MEQ PO TBCR
20.0000 meq | EXTENDED_RELEASE_TABLET | Freq: Once | ORAL | Status: AC
  Administered 2012-01-04: 20 meq via ORAL
  Filled 2012-01-04: qty 1

## 2012-01-04 SURGICAL SUPPLY — 46 items
BAG SPEC THK2 15X12 ZIP CLS (MISCELLANEOUS) ×1
BAG ZIPLOCK 12X15 (MISCELLANEOUS) ×2 IMPLANT
BANDAGE ELASTIC 4 VELCRO ST LF (GAUZE/BANDAGES/DRESSINGS) ×2 IMPLANT
BANDAGE ELASTIC 6 VELCRO ST LF (GAUZE/BANDAGES/DRESSINGS) ×1 IMPLANT
BIT DRILL 2.5X110 QC LCP DISP (BIT) ×2 IMPLANT
CLOTH BEACON ORANGE TIMEOUT ST (SAFETY) ×2 IMPLANT
CUFF TOURN SGL QUICK 34 (TOURNIQUET CUFF) ×2
CUFF TRNQT CYL 34X4X40X1 (TOURNIQUET CUFF) ×1 IMPLANT
DECANTER SPIKE VIAL GLASS SM (MISCELLANEOUS) ×2 IMPLANT
DRAPE C-ARM 42X72 X-RAY (DRAPES) ×2 IMPLANT
DRAPE U-SHAPE 47X51 STRL (DRAPES) ×2 IMPLANT
DRSG ADAPTIC 3X8 NADH LF (GAUZE/BANDAGES/DRESSINGS) ×2 IMPLANT
DRSG EMULSION OIL 3X16 NADH (GAUZE/BANDAGES/DRESSINGS) ×2 IMPLANT
DRSG PAD ABDOMINAL 8X10 ST (GAUZE/BANDAGES/DRESSINGS) ×2 IMPLANT
ELECT REM PT RETURN 9FT ADLT (ELECTROSURGICAL) ×2
ELECTRODE REM PT RTRN 9FT ADLT (ELECTROSURGICAL) ×1 IMPLANT
GLOVE ORTHO TXT STRL SZ7.5 (GLOVE) ×2 IMPLANT
GOWN PREVENTION PLUS LG XLONG (DISPOSABLE) ×2 IMPLANT
K-WIRE 2.0X150M (WIRE) ×2
KWIRE 2.0X150M (WIRE) IMPLANT
NEEDLE HYPO 22GX1.5 SAFETY (NEEDLE) ×2 IMPLANT
PACK LOWER EXTREMITY WL (CUSTOM PROCEDURE TRAY) ×2 IMPLANT
PAD CAST 4YDX4 CTTN HI CHSV (CAST SUPPLIES) ×2 IMPLANT
PADDING CAST ABS 4INX4YD NS (CAST SUPPLIES) ×1
PADDING CAST ABS COTTON 4X4 ST (CAST SUPPLIES) IMPLANT
PADDING CAST COTTON 4X4 STRL (CAST SUPPLIES) ×4
PLATE LCP 3.5 1/3 TUB 6HX69 (Plate) ×1 IMPLANT
POSITIONER SURGICAL ARM (MISCELLANEOUS) ×2 IMPLANT
SCREW CANC FT 12 4.0 (Screw) ×2 IMPLANT
SCREW CANC PT 4.0X45 (Screw) ×1 IMPLANT
SCREW CANC PT 4.0X50 (Screw) ×1 IMPLANT
SCREW CORTEX 3.5 14MM (Screw) ×1 IMPLANT
SCREW CORTEX 3.5 16MM (Screw) ×1 IMPLANT
SCREW CORTEX 3.5 18MM (Screw) ×1 IMPLANT
SCREW CORTEX 3.5 24MM (Screw) ×1 IMPLANT
SCREW LOCK CORT ST 3.5X14 (Screw) IMPLANT
SCREW LOCK CORT ST 3.5X16 (Screw) IMPLANT
SCREW LOCK CORT ST 3.5X18 (Screw) ×1 IMPLANT
SCREW LOCK CORT ST 3.5X24 (Screw) IMPLANT
SPONGE GAUZE 4X4 12PLY (GAUZE/BANDAGES/DRESSINGS) ×2 IMPLANT
STAPLER VISISTAT 35W (STAPLE) ×1 IMPLANT
SUT ETHILON 4 0 PS 2 18 (SUTURE) ×4 IMPLANT
SUT VIC AB 2-0 CT1 27 (SUTURE) ×2
SUT VIC AB 2-0 CT1 TAPERPNT 27 (SUTURE) ×1 IMPLANT
SYR CONTROL 10ML LL (SYRINGE) ×2 IMPLANT
TOWEL OR 17X26 10 PK STRL BLUE (TOWEL DISPOSABLE) ×2 IMPLANT

## 2012-01-04 NOTE — Progress Notes (Signed)
Subjective: Appreciate Ortho and Cards work.   Patient is in recovery room s/p surgery now.  Objective: Vital signs in last 24 hours: Temp:  [97.6 F (36.4 C)-98.3 F (36.8 C)] 98.3 F (36.8 C) (08/11 0930) Pulse Rate:  [67-100] 87  (08/11 0551) Resp:  [16-22] 18  (08/11 0551) BP: (135-150)/(65-99) 150/99 mmHg (08/11 0551) SpO2:  [93 %-95 %] 93 % (08/11 0551) Weight:  [94.348 kg (208 lb)-94.892 kg (209 lb 3.2 oz)] 94.892 kg (209 lb 3.2 oz) (08/11 0551) Weight change:     Intake/Output from previous day: 08/10 0701 - 08/11 0700 In: 240 [P.O.:240] Out: 850 [Urine:850] Intake/Output this shift: Total I/O In: 600 [I.V.:600] Out: 25 [Blood:25]  General appearance: alert, cooperative and appears stated age Resp: clear to auscultation bilaterally Cardio: irregularly irregular rhythm GI: soft, non-tender; bowel sounds normal; no masses,  no organomegaly Extremities: right ankle dressed. Neurologic: Grossly normal   Lab Results:  Basename 01/04/12 0607 01/03/12 1150  WBC 8.2 9.4  HGB 10.0* 10.7*  HCT 29.4* 31.2*  PLT 178 207   BMET  Basename 01/04/12 0607 01/03/12 1150  NA 133* 133*  K 3.0* 3.1*  CL 98 97  CO2 27 30  GLUCOSE 91 91  BUN 11 14  CREATININE 0.67 0.72  CALCIUM 8.2* 8.6   CMET CMP     Component Value Date/Time   NA 133* 01/04/2012 0607   K 3.0* 01/04/2012 0607   CL 98 01/04/2012 0607   CO2 27 01/04/2012 0607   GLUCOSE 91 01/04/2012 0607   BUN 11 01/04/2012 0607   CREATININE 0.67 01/04/2012 0607   CREATININE 0.99 09/27/2010 1542   CALCIUM 8.2* 01/04/2012 0607   PROT 5.9* 01/04/2012 0607   ALBUMIN 2.7* 01/04/2012 0607   AST 17 01/04/2012 0607   ALT 13 01/04/2012 0607   ALKPHOS 60 01/04/2012 0607   BILITOT 1.1 01/04/2012 0607   GFRNONAA 85* 01/04/2012 0607   GFRAA >90 01/04/2012 0607     Studies/Results: Dg Chest 2 View  01/03/2012  *RADIOLOGY REPORT*  Clinical Data: Cough and shortness of breath.  CHEST - 2 VIEW  Comparison: Chest x-ray 08/05/2011.   Findings: Lung volumes are low.  Right lower lobe airspace consolidation concerning for pneumonia.  Small bilateral pleural effusions.  Pulmonary venous congestion without frank pulmonary edema.  Mild enlargement of the cardiopericardial silhouette. Tortuosity and atherosclerosis of the thoracic aorta.  A left-sided pacemaker device in place with lead tips projecting over the expected location of the right atrium and right ventricular apex.  IMPRESSION: 1.  Findings concerning for right lower lobe pneumonia. 2.  Small bilateral pleural effusions. 3.  Mild cardiomegaly with pulmonary venous congestion, but no frank pulmonary edema.  4.  Atherosclerosis.  Original Report Authenticated By: Florencia Reasons, M.D.   Dg Ankle Complete Right  01/03/2012  *RADIOLOGY REPORT*  Clinical Data: History of fall complaining of ankle pain.  RIGHT ANKLE - COMPLETE 3+ VIEW  Comparison: No priors.  Findings: There is a trimalleolar fracture of the right ankle. There is medial angulation of both the distal fibular fracture and the fracture through the medial malleolus.  Mild posterior displacement of the fracture of the posterior aspect of the distal tibia is noted.  Extensive overlying soft tissue swelling. Visualized portions of the hindfoot and midfoot appear intact.  IMPRESSION: 1.  Trimalleolar fracture of the right ankle, as detailed above.  Original Report Authenticated By: Florencia Reasons, M.D.    Medications: I have reviewed the patient's  current medications.     . sodium chloride   Intravenous Once  . acidophilus  1 capsule Oral Daily  . aspirin EC  81 mg Oral Daily  . azithromycin  500 mg Intravenous Once  . carvedilol  25 mg Oral BID WC  . cefTRIAXone (ROCEPHIN)  IV  1 g Intravenous Once  . cefTRIAXone (ROCEPHIN)  IV  1 g Intravenous Q24H  . enalapril  10 mg Oral Daily  . enoxaparin (LOVENOX) injection  40 mg Subcutaneous Q24H  . escitalopram  10 mg Oral Daily  . furosemide  40 mg Oral Daily  .   morphine injection  4 mg Intravenous Once  . potassium chloride  30 mEq Oral Once  . potassium chloride SA  20 mEq Oral Daily  . sodium chloride  3 mL Intravenous Q12H  . Vitamin D (Ergocalciferol)  50,000 Units Oral Q Sun  . DISCONTD: furosemide  40 mg Oral BID     Assessment/Plan:  Principal Problem:  *Ankle fracture, right-s/p surgery today.  Follow on telemetry. Replace potassium and watch for fluid overload.   He is currently just on one 40 mg lasix daily and I will defer increase in that to cardiology at the moment.   Active Problems:  Seizure disorder-not really a true seizure disorder.  Arthritis of knee-PT /OT Chronic Systolic and Diastolic chf with some fluid overload.  Follow closely. RLL CAP-on rocephin.    LOS: 1 day   Ezequiel Kayser, MD 01/04/2012, 9:55 AM

## 2012-01-04 NOTE — Brief Op Note (Signed)
01/03/2012 - 01/04/2012  9:01 AM  PATIENT:  John [name]  78 y.o. male  PRE-OPERATIVE DIAGNOSIS:  right trimalleolar ankle fracture  POST-OPERATIVE DIAGNOSIS:  right trimalleolar ankle fracture  PROCEDURE:  Procedure(s) (LRB): OPEN REDUCTION INTERNAL FIXATION (ORIF) ANKLE FRACTURE (Right) bimalleolar fixation of trimalleolar ankle fracture.   SURGEON:  Surgeon(s) and Role:    * Eldred Manges, MD - Primary  PHYSICIAN ASSISTANT:   ASSISTANTS: none   ANESTHESIA:   local and spinal  EBL:     BLOOD ADMINISTERED:none  DRAINS: none   LOCAL MEDICATIONS USED:  MARCAINE     SPECIMEN:  No Specimen  DISPOSITION OF SPECIMEN:  N/A  COUNTS:  YES  TOURNIQUET:   Total Tourniquet Time Documented: Thigh (Right) - 35 minutes  DICTATION: .Other Dictation: Dictation Number [id number]  PLAN OF CARE: still inpatient  PATIENT DISPOSITION:  PACU - hemodynamically stable.   Delay start of Pharmacological VTE agent (>24hrs) due to surgical blood loss or risk of bleeding: yes

## 2012-01-04 NOTE — Progress Notes (Signed)
Patient ID: John [name], male   DOB: 1923/07/08, 78 y.o.   MRN: [id number] H and P updated. Procedure discussed. Pt understands and agrees to proceed. All ?'s answered. Plan ORIF of ankle.

## 2012-01-04 NOTE — Anesthesia Postprocedure Evaluation (Signed)
  Anesthesia Post-op Note  Patient: John [name]  Procedure(s) Performed: Procedure(s) (LRB): OPEN REDUCTION INTERNAL FIXATION (ORIF) ANKLE FRACTURE (Right)  Patient Location: PACU  Anesthesia Type: Spinal  Level of Consciousness: awake and alert   Airway and Oxygen Therapy: Patient Spontanous Breathing  Post-op Pain: mild  Post-op Assessment: Post-op Vital signs reviewed, Patient's Cardiovascular Status Stable, Respiratory Function Stable, Patent Airway and No signs of Nausea or vomiting  Post-op Vital Signs: stable  Complications: No apparent anesthesia complications

## 2012-01-04 NOTE — Anesthesia Preprocedure Evaluation (Addendum)
Anesthesia Evaluation  Patient identified by MRN, date of birth, ID band Patient awake    Reviewed: Allergy & Precautions, H&P , NPO status , Patient's Chart, lab work & pertinent test results  Airway Mallampati: II TM Distance: >3 FB Neck ROM: Full    Dental No notable dental hx. (+) Poor Dentition, Loose, Missing and Chipped   Pulmonary neg pulmonary ROS,  breath sounds clear to auscultation  Pulmonary exam normal       Cardiovascular hypertension, Pt. on medications +CHF negative cardio ROS  + dysrhythmias Atrial Fibrillation + pacemaker + Valvular Problems/Murmurs MR Rhythm:Irregular Rate:Normal     Neuro/Psych negative neurological ROS  negative psych ROS   GI/Hepatic negative GI ROS, Neg liver ROS,   Endo/Other  negative endocrine ROS  Renal/GU negative Renal ROS  negative genitourinary   Musculoskeletal negative musculoskeletal ROS (+)   Abdominal   Peds negative pediatric ROS (+)  Hematology negative hematology ROS (+)   Anesthesia Other Findings   Reproductive/Obstetrics negative OB ROS                          Anesthesia Physical Anesthesia Plan  ASA: III  Anesthesia Plan: Spinal   Post-op Pain Management:    Induction:   Airway Management Planned: Simple Face Mask  Additional Equipment:   Intra-op Plan:   Post-operative Plan:   Informed Consent: I have reviewed the patients History and Physical, chart, labs and discussed the procedure including the risks, benefits and alternatives for the proposed anesthesia with the patient or authorized representative who has indicated his/her understanding and acceptance.   Dental advisory given  Plan Discussed with: CRNA  Anesthesia Plan Comments:         Anesthesia Quick Evaluation

## 2012-01-04 NOTE — Preoperative (Signed)
Beta Blockers   Reason not to administer Beta Blockers:Not Applicable 

## 2012-01-04 NOTE — Anesthesia Procedure Notes (Signed)
Spinal Patient location during procedure: OR Staffing Anesthesiologist: Lam Bjorklund Performed by: anesthesiologist  Preanesthetic Checklist Completed: patient identified, site marked, surgical consent, pre-op evaluation, timeout performed, IV checked, risks and benefits discussed and monitors and equipment checked Spinal Block Patient position: sitting Prep: Betadine Patient monitoring: heart rate, continuous pulse ox and blood pressure Approach: left paramedian Location: L3-4 Injection technique: single-shot Needle Needle type: Spinocan  Needle gauge: 22 G Needle length: 9 cm Additional Notes Expiration date of kit checked and confirmed. Patient tolerated procedure well, without complications.     

## 2012-01-04 NOTE — Transfer of Care (Signed)
Immediate Anesthesia Transfer of Care Note  Patient: John [name]  Procedure(s) Performed: Procedure(s) (LRB): OPEN REDUCTION INTERNAL FIXATION (ORIF) ANKLE FRACTURE (Right)  Patient Location: PACU  Anesthesia Type: Regional  Level of Consciousness: awake, alert  and oriented  Airway & Oxygen Therapy: Patient Spontanous Breathing and Patient connected to face mask oxygen  Post-op Assessment: Report given to PACU RN and Post -op Vital signs reviewed and stable  Post vital signs: Reviewed and stable  Complications: No apparent anesthesia complications

## 2012-01-04 NOTE — Progress Notes (Signed)
John John [name]  Subjective:  Doing well post op. No SOB, no CP. Wife at bedside.   Objective:  Vital Signs in the last 24 hours: Temp:  [97.5 F (36.4 C)-98.3 F (36.8 C)] 97.5 F (36.4 C) (08/11 1134) Pulse Rate:  [87-98] 93  (08/11 1134) Resp:  [15-22] 16  (08/11 1134) BP: (125-150)/(59-99) 145/81 mmHg (08/11 1134) SpO2:  [93 %-100 %] 94 % (08/11 1134) Weight:  [94.348 kg (208 lb)-94.892 kg (209 lb 3.2 oz)] 94.892 kg (209 lb 3.2 oz) (08/11 0551)  Intake/Output from previous day: 08/10 0701 - 08/11 0700 In: 240 [P.O.:240] Out: 850 [Urine:850]   Physical Exam: General: Well developed, well nourished, in no acute distress. Elderly Head:  Normocephalic and atraumatic.no JVD Lungs: Clear to auscultation and percussion. John [name]: Irreg irreg.  3/6 HSM apex,no rubs or gallops.  Abdomen: soft, non-tender, positive bowel sounds. Extremities: No clubbing or cyanosis. No edema. Neurologic: Alert and oriented x 3.    Lab Results:  Basename 01/04/12 0607 01/03/12 1150  WBC 8.2 9.4  HGB 10.0* 10.7*  PLT 178 207    Basename 01/04/12 0607 01/03/12 1150  NA 133* 133*  K 3.0* 3.1*  CL 98 97  CO2 27 30  GLUCOSE 91 91  BUN 11 14  CREATININE 0.67 0.72   No results found for this basename: TROPONINI:2,CK,MB:2 in the last 72 hours Hepatic Function Panel  Basename 01/04/12 0607  PROT 5.9*  ALBUMIN 2.7*  AST 17  ALT 13  ALKPHOS 60  BILITOT 1.1  BILIDIR --  IBILI --    Telemetry: AFIB rate <100 Personally viewed.    Assessment/Plan:  Principal Problem:  *Pneumonia Active Problems:  CARDIOMYOPATHY, PRIMARY, DILATED  Atrial fibrillation  DIASTOLIC John [name] FAILURE, CHRONIC  Pacemaker  [employer]  Ankle fracture, right  Seizure disorder  Arthritis of knee  1. AFIB - stable. Rate controlled. Coreg 25 bid. ASA only. Not a candidate for coumadin due to subdural in the past.  2. Pacer - normal function.   3. Chronic Diastolic HF - currently in no distress. Getting IVF. Be  careful. Getting furosemide 40 QD. ECHO 2010 with EF 55%. BNP 3931.   4. Hypokalemia - getting KCL PO.    5. Moderate tricuspid and mitral regurgitation.   Will follow along.   John John [name] 01/04/2012, 12:32 PM

## 2012-01-05 LAB — COMPREHENSIVE METABOLIC PANEL
BUN: 11 mg/dL (ref 6–23)
Calcium: 8.2 mg/dL — ABNORMAL LOW (ref 8.4–10.5)
GFR calc Af Amer: >90 mL/min (ref >90)
Glucose, Bld: 91 mg/dL (ref 70–99)
Total Protein: 6 g/dL (ref 6.0–8.3)

## 2012-01-05 LAB — DIFFERENTIAL
Basophils Absolute: 0 10*3/uL (ref 0.0–0.1)
Lymphocytes Relative: 14 % (ref 12–46)
Monocytes Absolute: 1.1 10*3/uL — ABNORMAL HIGH (ref 0.1–1.0)
Monocytes Relative: 13 % — ABNORMAL HIGH (ref 3–12)
Neutro Abs: 6.3 10*3/uL (ref 1.7–7.7)

## 2012-01-05 LAB — CBC
HCT: 29.4 % — ABNORMAL LOW (ref 39.0–52.0)
Hemoglobin: 9.9 g/dL — ABNORMAL LOW (ref 13.0–17.0)
WBC: 8.8 10*3/uL (ref 4.0–10.5)

## 2012-01-05 MED ORDER — POTASSIUM CHLORIDE CRYS ER 20 MEQ PO TBCR
20.0000 meq | EXTENDED_RELEASE_TABLET | Freq: Once | ORAL | Status: AC
  Administered 2012-01-05: 20 meq via ORAL
  Filled 2012-01-05: qty 1

## 2012-01-05 NOTE — Op Note (Signed)
NAME:  John [name], SINNING NO.:  [id number]  MEDICAL RECORD NO.:  [id number]  LOCATION:  1445                         FACILITY:  [facility]  PHYSICIAN:  Terrisha Lopata C. Ophelia Charter, M.D.    DATE OF BIRTH:  1923-10-12  DATE OF PROCEDURE:  01/04/2012 DATE OF DISCHARGE:                              OPERATIVE REPORT   PREOPERATIVE DIAGNOSIS:  Right displaced trimalleolar ankle fracture.  POSTOPERATIVE DIAGNOSIS:  Right displaced trimalleolar ankle fracture.  PROCEDURE:  ORIF, right ankle, bimalleolar fixation of trimalleolar ankle fracture.  SURGEON:  Meng Winterton C. Ophelia Charter, MD  ANESTHESIA:  Spinal plus Marcaine skin local 10 mL.  ESTIMATED BLOOD LOSS:  Minimal.  TOURNIQUET TIME:  Less than an hour.  DRAINS:  None.  An 78 year old male with trimalleolar ankle fracture displaced.  PROCEDURE IN DETAIL:  After induction of spinal anesthesia, proximal thigh tourniquet, prepped with DuraPrep up to the knee.  Stockinette, extremity sheaths, and drains were applied.  Sterile skin marker and time-out procedure completed.  Length marked with an Esmarch, tourniquet was inflated to 350.  A medial incision was made first, this was an oblique fracture of the medial malleolus which was displaced, fracturing tunnel was irrigated.  Attention was turned lateral.  Lateral incision made at the fibula.  Fibula fracture was short, oblique, and less spiraled and normal and was more of an abduction type ankle fracture with supination and external rotation.  One-third tibial plate was placed.  Distals were cancellous unicortical 12 mm and 14, 16, and 18 bicortical screws were placed proximally.  The patient had a fairly wide fibula, medial lateral diameter.  Lag screw was placed across the fracture site from anterior proximal to distal posterior locking down the fracture.  Attention was turned medial.  Fracture was reduced with K- wire and then 2 partially threaded cancellous lag screws were placed, one 45,  another 50 mm.  Final spot pictures were taken showing reduction, good position.  Syndesmosis was stable.  Wounds were irrigated and tourniquet deflated.  Subcutaneous tissue reapproximated with 2-0 Vicryl, skin staple closure.  Postop dressing short-leg splint.  Instrument count and needle count was correct.     Lavora Brisbon C. Ophelia Charter, M.D.     MCY/MEDQ  D:  01/04/2012  T:  01/05/2012  Job:  [id number]

## 2012-01-05 NOTE — Progress Notes (Signed)
Subjective: No complaints.  Pain is controlled.  Breathing is improving with decreased cough and shortness of breath.  Denies orthopnea.  Objective: Vital signs in last 24 hours: Temp:  [97.5 F (36.4 C)-98.3 F (36.8 C)] 97.8 F (36.6 C) (08/12 0511) Pulse Rate:  [93-100] 93  (08/12 0511) Resp:  [15-18] 18  (08/12 0511) BP: (108-152)/(57-83) 150/78 mmHg (08/12 0511) SpO2:  [94 %-100 %] 97 % (08/12 0511) Weight:  [94.847 kg (209 lb 1.6 oz)] 94.847 kg (209 lb 1.6 oz) (08/12 0511) Weight change: 0.499 kg (1 lb 1.6 oz)    CBG (last 3)  No results found for this basename: GLUCAP:3 in the last 72 hours  Intake/Output from previous day: 08/11 0701 - 08/12 0700 In: 960 [P.O.:260; I.V.:700] Out: 1025 [Urine:1000; Blood:25] Intake/Output this shift:    General appearance: alert and no distress Eyes: no scleral icterus Throat: oropharynx moist without erythema Resp: clear to auscultation bilaterally Cardio: irregularly irregular rhythm and grade 3/6 HSM over apex GI: soft, non-tender; bowel sounds normal; no masses,  no organomegaly Extremities: no clubbing, cyanosis or edema; right ankle splint in place  Lab Results:  [facility] 01/05/12 0432 01/04/12 0607  NA 135 133*  K 3.5 3.0*  CL 99 98  CO2 28 27  GLUCOSE 91 91  BUN 11 11  CREATININE 0.67 0.67  CALCIUM 8.2* 8.2*  MG -- --  PHOS -- --    Basename 01/05/12 0432 01/04/12 0607  AST 15 17  ALT 12 13  ALKPHOS 64 60  BILITOT 0.7 1.1  PROT 6.0 5.9*  ALBUMIN 2.7* 2.7*    Basename 01/05/12 0432 01/04/12 0607  WBC 8.8 8.2  NEUTROABS 6.3 5.8  HGB 9.9* 10.0*  HCT 29.4* 29.4*  MCV 89.6 88.0  PLT 203 178   Lab Results  Component Value Date   INR 1.1 ratio* 05/07/2009   INR 1.1 RATIO* 01/24/2008   No results found for this basename: CKTOTAL:3,CKMB:3,CKMBINDEX:3,TROPONINI:3 in the last 72 hours No results found for this basename: TSH,T4TOTAL,FREET3,T3FREE,THYROIDAB in the last 72 hours No results found for this  basename: VITAMINB12:2,FOLATE:2,FERRITIN:2,TIBC:2,IRON:2,RETICCTPCT:2 in the last 72 hours  Studies/Results: Dg Chest 2 View  01/03/2012  *RADIOLOGY REPORT*  Clinical Data: Cough and shortness of breath.  CHEST - 2 VIEW  Comparison: Chest x-ray 08/05/2011.  Findings: Lung volumes are low.  Right lower lobe airspace consolidation concerning for pneumonia.  Small bilateral pleural effusions.  Pulmonary venous congestion without frank pulmonary edema.  Mild enlargement of the cardiopericardial silhouette. Tortuosity and atherosclerosis of the thoracic aorta.  A left-sided pacemaker device in place with lead tips projecting over the expected location of the right atrium and right ventricular apex.  IMPRESSION: 1.  Findings concerning for right lower lobe pneumonia. 2.  Small bilateral pleural effusions. 3.  Mild cardiomegaly with pulmonary venous congestion, but no frank pulmonary edema.  4.  Atherosclerosis.  Original Report Authenticated By: Florencia Reasons, M.D.   Dg Ankle 2 Views Right  01/04/2012  *RADIOLOGY REPORT*  Clinical Data: ORIF distal fibular and medial malleolar fractures of the right ankle.  RIGHT ANKLE - 2 VIEW  Comparison: Right ankle x-rays yesterday.  Findings: 3 spot images from the C-arm fluoroscopic device, AP and lateral views of the right ankle, were submitted for interpretation post-operatively.  Plate screw fixation of the comminuted distal fibular fracture.  Compression screw fixation of the medial malleolar fracture.  Anatomic alignment.  Intact ankle mortise.  IMPRESSION: Anatomic alignment post ORIF.  Original Report Authenticated By:  Arnell Sieving, M.D.   Dg Ankle Complete Right  01/03/2012  *RADIOLOGY REPORT*  Clinical Data: History of fall complaining of ankle pain.  RIGHT ANKLE - COMPLETE 3+ VIEW  Comparison: No priors.  Findings: There is a trimalleolar fracture of the right ankle. There is medial angulation of both the distal fibular fracture and the fracture  through the medial malleolus.  Mild posterior displacement of the fracture of the posterior aspect of the distal tibia is noted.  Extensive overlying soft tissue swelling. Visualized portions of the hindfoot and midfoot appear intact.  IMPRESSION: 1.  Trimalleolar fracture of the right ankle, as detailed above.  Original Report Authenticated By: Florencia Reasons, M.D.   Dg C-arm 1-60 Min-no Report  01/04/2012  CLINICAL DATA: fracture right ankle   C-ARM 1-60 MINUTES  Fluoroscopy was utilized by the requesting physician.  No radiographic  interpretation.       Medications: Scheduled:   . acidophilus  1 capsule Oral Daily  . aspirin EC  81 mg Oral Daily  . carvedilol  25 mg Oral BID WC  . cefTRIAXone (ROCEPHIN)  IV  1 g Intravenous Q24H  . docusate sodium  100 mg Oral BID  . enalapril  10 mg Oral Daily  . enoxaparin (LOVENOX) injection  40 mg Subcutaneous Daily  . escitalopram  10 mg Oral Daily  . furosemide  40 mg Oral Daily  . potassium chloride SA  20 mEq Oral Daily  . potassium chloride  20 mEq Oral Once  . potassium chloride  20 mEq Oral Once  . sodium chloride  3 mL Intravenous Q12H  . Vitamin D (Ergocalciferol)  50,000 Units Oral Q Sun  . DISCONTD: enoxaparin (LOVENOX) injection  40 mg Subcutaneous Q24H   Continuous:   . DISCONTD: lactated ringers      Assessment/Plan: Principal Problem: 1. *Pneumonia- continue Rocephin (Day 4/7).  Will avoid Avelox due to history of isolated seizure.  Wean O2 as tolerated.  Encouraged Incentive Spirometry.  Active Problems: 2. Atrial fibrillation- continue rate control with Carvedilol.  No anticoagulation due to prior SDH. 3. CARDIOMYOPATHY, PRIMARY, DILATED/DIASTOLIC HEART FAILURE, CHRONIC- continue medical therapy.  IVFs have been stopped.  Appears stable. 4. Ankle fracture, right- s/p ORIF (POD #1)- PT/OT evaluation.  Post-op management per Ortho.  Continue Lovenox for DVT prophylaxis. 5. Hypokalemia- replete orally. 6. Disposition-  anticipate discharge to SNF rehab in 2 days if stable.  PT/OT/SW evaluation today   LOS: 2 days   SHAW,W DOUGLAS 01/05/2012, 7:22 AM

## 2012-01-05 NOTE — Progress Notes (Signed)
Subjective: 1 Day Post-Op Procedure(s) (LRB): OPEN REDUCTION INTERNAL FIXATION (ORIF) ANKLE FRACTURE (Right) Patient reports pain as mild.    Objective: Vital signs in last 24 hours: Temp:  [97.4 F (36.3 C)-97.8 F (36.6 C)] 97.4 F (36.3 C) (08/12 0955) Pulse Rate:  [90-100] 90  (08/12 0955) Resp:  [16-18] 18  (08/12 0955) BP: (107-152)/(57-81) 107/70 mmHg (08/12 0955) SpO2:  [94 %-100 %] 97 % (08/12 0955) Weight:  [94.847 kg (209 lb 1.6 oz)] 94.847 kg (209 lb 1.6 oz) (08/12 0511)  Intake/Output from previous day: 08/11 0701 - 08/12 0700 In: 960 [P.O.:260; I.V.:700] Out: 1025 [Urine:1000; Blood:25] Intake/Output this shift: Total I/O In: 243 [P.O.:240; I.V.:3] Out: -    Basename 01/05/12 0432 01/04/12 0607 01/03/12 1150  HGB 9.9* 10.0* 10.7*    Basename 01/05/12 0432 01/04/12 0607  WBC 8.8 8.2  RBC 3.28* 3.34*  HCT 29.4* 29.4*  PLT 203 178    Basename 01/05/12 0432 01/04/12 0607  NA 135 133*  K 3.5 3.0*  CL 99 98  CO2 28 27  BUN 11 11  CREATININE 0.67 0.67  GLUCOSE 91 91  CALCIUM 8.2* 8.2*   No results found for this basename: LABPT:2,INR:2 in the last 72 hours  Neurologically intact  Assessment/Plan: 1 Day Post-Op Procedure(s) (LRB): OPEN REDUCTION INTERNAL FIXATION (ORIF) ANKLE FRACTURE (Right) Up with therapy.  He was 1plus assist with wife using belt around his waist to get OOB and standing with walker with use of 2 legs. Now with NWB on ankle fracture his mobility will decline. Plan slfc in  2 wks and could consider 50% PWB at 3 wks post op with cast on to protect allignment of unstable fracture. Likely placement needed vs. Home with Hoya lift, HHPT, Neurosurgeon. Etc.      Beeper  [phone] Hadlea Furuya C 01/05/2012, 10:51 AM

## 2012-01-05 NOTE — Progress Notes (Signed)
Patient has a bed at [facility] when medically stable for discharge. FL2 on shadow chart for MD signature.   Clinical Social Work Department CLINICAL SOCIAL WORK PLACEMENT NOTE 01/05/2012  Patient:  John [name], John [name]  Account Number:  [id number] Admit date:  01/03/2012  Clinical Social Worker:  Orpah Greek  Date/time:  01/05/2012 04:30 PM  Clinical Social Work is seeking post-discharge placement for this patient at the following level of care:   SKILLED NURSING   (*CSW will update this form in Epic as items are completed)   01/05/2012  Patient/family provided with [facility] Department of Clinical Social Work's list of facilities offering this level of care within the geographic area requested by the patient (or if unable, by the patient's family).  01/05/2012  Patient/family informed of their freedom to choose among providers that offer the needed level of care, that participate in Medicare, Medicaid or managed care program needed by the patient, have an available bed and are willing to accept the patient.  01/05/2012  Patient/family informed of [facility]' ownership interest in [facility], as well as of the fact that they are under no obligation to receive care at this facility.  PASARR submitted to EDS on 01/05/2012 PASARR number received from EDS on 01/05/2012  FL2 transmitted to all facilities in geographic area requested by pt/family on  01/05/2012 FL2 transmitted to all facilities within larger geographic area on   Patient informed that his/her managed care company has contracts with or will negotiate with  certain facilities, including the following:     Patient/family informed of bed offers received:  01/05/2012 Patient chooses bed at [facility] AND [facility] Physician recommends and patient chooses bed at    Patient to be transferred to  on   Patient to be transferred to facility by   The following physician request were entered in  Epic:   Additional Comments:  Unice Bailey, LCSW [facility] Clinical Social Worker cell #: [phone]

## 2012-01-05 NOTE — Evaluation (Signed)
Physical Therapy Evaluation Patient Details Name: John [name] MRN: [id number] DOB: February 23, 1924 Today's Date: 01/05/2012 Time: 4098-1191 PT Time Calculation (min): 26 min  PT Assessment / Plan / Recommendation Clinical Impression  Pt admitted for pneumonia and s/p fall requiring ORIF of R ankle.  Pt presents with decreased strength, endurance, and mobility as well as poor awareness of deficits and need for assistance since now NWB on R LE.  Pt would benefit from acute PT services in order to improve strength, safety awareness, and transfer status.     PT Assessment  Patient needs continued PT services    Follow Up Recommendations  Skilled nursing facility    Barriers to Discharge        Equipment Recommendations  Defer to next venue    Recommendations for Other Services     Frequency Min 3X/week    Precautions / Restrictions Precautions Precautions: Fall Restrictions Weight Bearing Restrictions: Yes RLE Weight Bearing: Non weight bearing   Pertinent Vitals/Pain No pain at rest, denies pain with mobility, elevated R LE      Mobility  Bed Mobility Bed Mobility: Supine to Sit;Sitting - Scoot to [place] of Bed;Sit to Supine Supine to Sit: 1: +2 Total assist;HOB elevated Supine to Sit: Patient Percentage: 10% Sitting - Scoot to Edge of Bed: 1: +2 Total assist Sitting - Scoot to Edge of Bed: Patient Percentage: 10% Sit to Supine: 1: +2 Total assist;HOB elevated Sit to Supine: Patient Percentage: 10% Details for Bed Mobility Assistance: pt required increased assist of upper and lower boday, support given for R LE Transfers Transfers: Sit to Stand Sit to Stand: 1: +2 Total assist Sit to Stand: Patient Percentage: 0% Details for Transfer Assistance: attempted however pt requiring increased assist and poor ability to maintain NWB on R LE even with therapist foot keeping LE forward Ambulation/Gait Ambulation/Gait Assistance: Not tested (comment)    Exercises General Exercises  - Lower Extremity Ankle Circles/Pumps: Left;15 reps;Seated;AROM Long [employer]: AROM;Strengthening;Left;15 reps;Seated Hip Flexion/Marching: AROM;Left;15 reps;Seated   PT Diagnosis: Generalized weakness;Difficulty walking  PT Problem List: Decreased strength;Decreased activity tolerance;Decreased mobility;Decreased balance;Decreased safety awareness;Decreased knowledge of use of DME;Decreased knowledge of precautions PT Treatment Interventions: DME instruction;Functional mobility training;Therapeutic activities;Therapeutic exercise;Balance training;Patient/family education;Wheelchair mobility training   PT Goals Acute Rehab PT Goals PT Goal Formulation: With patient Time For Goal Achievement: 01/19/12 Potential to Achieve Goals: Fair Pt will go Supine/Side to Sit: with min assist PT Goal: Supine/Side to Sit - Progress: Goal set today Pt will go Sit to Supine/Side: with min assist PT Goal: Sit to Supine/Side - Progress: Goal set today Pt will go Sit to Stand: with mod assist PT Goal: Sit to Stand - Progress: Goal set today Pt will go Stand to Sit: with mod assist PT Goal: Stand to Sit - Progress: Goal set today Pt will Transfer Bed to Chair/Chair to Bed: with mod assist PT Transfer Goal: Bed to Chair/Chair to Bed - Progress: Goal set today Pt will Propel Wheelchair: with supervision PT Goal: Propel Wheelchair - Progress: Goal set today  Visit Information  Last PT Received On: 01/05/12 Assistance Needed: +2    Subjective Data  Subjective: I need to get to the bathroom.  (explained pt will need to use bedpan today)   Prior Functioning  Home Living Lives With: Spouse Additional Comments: Pt will need SNF upon d/c. Prior Function Level of Independence: Independent with assistive device(s) Comments: Spouse reports pt able to ambulate with RW however kept it far away likely cause  of his fall (per spouse). Communication Communication: HOH    Cognition  Overall Cognitive Status:  Impaired Area of Impairment: Memory;Safety/judgement Arousal/Alertness: Awake/alert Behavior During Session: [facility] for tasks performed Safety/Judgement: Decreased awareness of safety precautions;Decreased safety judgement for tasks assessed;Decreased awareness of need for assistance Cognition - Other Comments: pt not aware of need for assistance/ability to perform tasks, somewhat confused at times during evaluation requiring repeat of answers (spouse also reports some confusion this morning)    Extremity/Trunk Assessment Right Lower Extremity Assessment RLE ROM/Strength/Tone: Deficits RLE ROM/Strength/Tone Deficits: ankle casted but pt able to wiggle toes, unable to lift LE against gravity without assist Left Lower Extremity Assessment LLE ROM/Strength/Tone: [facility] for tasks assessed   Balance    End of Session PT - End of Session Equipment Utilized During Treatment: Gait belt Activity Tolerance: Patient limited by fatigue Patient left: in bed;with call bell/phone within reach;with bed alarm set;with family/visitor present Nurse Communication: Need for lift equipment;Weight bearing status  GP     John [name],John [name] 01/05/2012, 10:48 AM Pager: [phone]

## 2012-01-05 NOTE — Evaluation (Signed)
Occupational Therapy Evaluation Patient Details Name: John [name] MRN: [id number] DOB: 11-Feb-1924 Today's Date: 01/05/2012 Time: 1240-1300 OT Time Calculation (min): 20 min  OT Assessment / Plan / Recommendation Clinical Impression  Pt admitted for pneumonia and s/p fall requiring ORIF of R ankle.  Pt presents with decreased strength and functional mobility and ADL. Will benefit from skilled OT services to help improve independence with ADL for next venue of care.     OT Assessment  Patient needs continued OT Services    Follow Up Recommendations  Skilled nursing facility    Barriers to Discharge      Equipment Recommendations  Defer to next venue    Recommendations for Other Services    Frequency  Min 1X/week    Precautions / Restrictions Precautions Precautions: Fall Restrictions Weight Bearing Restrictions: Yes RLE Weight Bearing: Non weight bearing        ADL  Eating/Feeding: Simulated;Independent Where Assessed - Eating/Feeding: Bed level Grooming: Wash/dry face;Set up Where Assessed - Grooming: Supine, head of bed up Upper Body Bathing: Simulated;Chest;Right arm;Left arm;Abdomen;Supervision/safety;Set up Where Assessed - Upper Body Bathing: Supine, head of bed up Lower Body Bathing: Simulated;+1 Total assistance Where Assessed - Lower Body Bathing: Rolling right and/or left Upper Body Dressing: Simulated;Minimal assistance Where Assessed - Upper Body Dressing: Supine, head of bed up Lower Body Dressing: Simulated;+1 Total assistance Where Assessed - Lower Body Dressing: Rolling right and/or left Toilet Transfer: Simulated;Moderate assistance;Other (comment) (simulate roll for bedpan) Toileting - Clothing Manipulation and Hygiene: Simulated;+1 Total assistance Where Assessed - Toileting Clothing Manipulation and Hygiene: Rolling right and/or left ADL Comments: Pt able to reach for rails to help with rolling but he can only complete partial roll--needed assist  with bedpad to roll further onto each side.     OT Diagnosis: Generalized weakness;Acute pain  OT Problem List: Decreased strength;Decreased knowledge of use of DME or AE;Pain OT Treatment Interventions: Self-care/ADL training;Therapeutic activities;DME and/or AE instruction;Patient/family education   OT Goals Acute Rehab OT Goals Time For Goal Achievement: 01/19/12 Potential to Achieve Goals: Good ADL Goals Pt Will Perform Grooming: with supervision;Unsupported;Sitting, edge of bed;Sitting, chair ADL Goal: Grooming - Progress: Goal set today Pt Will Perform Lower Body Bathing: with min assist;Sitting, chair;Sitting, edge of bed;Unsupported;Other (comment) (lean side to side) ADL Goal: Lower Body Bathing - Progress: Goal set today Pt Will Perform Lower Body Dressing: with mod assist;Unsupported;Other (comment) (lean side to side) ADL Goal: Lower Body Dressing - Progress: Goal set today Pt Will Transfer to Toilet: with min assist;Supine HOB flat, rolling right/left for bedpan;Other (comment) (will roll for bedpan placement) ADL Goal: Toilet Transfer - Progress: Goal set today  Visit Information  Last OT Received On: 01/05/12 Assistance Needed: +2    Subjective Data  Subjective: I like the comics Patient Stated Goal: none stated. agreeable to work with OT   Prior Functioning  Vision/Perception  Home Living Lives With: Spouse Available Help at Discharge: Skilled Nursing Facility Home Adaptive Equipment: Shower chair with back Additional Comments: Pt will need SNF upon d/c. Prior Function Level of Independence: Independent with assistive device(s) Comments: Pt states he did his own bathing, dressing and toileting. Communication Communication: HOH Dominant Hand: Right      Cognition  Overall Cognitive Status: Impaired Area of Impairment: Memory Arousal/Alertness: Awake/alert Behavior During Session: WFL for tasks performed Safety/Judgement: Decreased awareness of safety  precautions;Decreased safety judgement for tasks assessed;Decreased awareness of need for assistance Cognition - Other Comments: pt with some noted memory issues--had difficulty recalling  exact events of fall at home.     Extremity/Trunk Assessment Right Upper Extremity Assessment RUE ROM/Strength/Tone: WFL for tasks assessed Left Upper Extremity Assessment LUE ROM/Strength/Tone: WFL for tasks assessed Right Lower Extremity Assessment RLE ROM/Strength/Tone: Deficits RLE ROM/Strength/Tone Deficits: ankle casted but pt able to wiggle toes, unable to lift LE against gravity without assist Left Lower Extremity Assessment LLE ROM/Strength/Tone: [facility] for tasks assessed   Mobility Bed Mobility Bed Mobility: Rolling Right;Rolling Left Rolling Right: 3: Mod assist;With rail Rolling Left: 3: Mod assist;With rail      Balance    End of Session OT - End of Session Activity Tolerance: Patient limited by pain Patient left: in bed;with call bell/phone within reach;with bed alarm set  GO     Lennox Laity [phone] 01/05/2012, 1:17 PM

## 2012-01-05 NOTE — Progress Notes (Signed)
[place] [facility]  Subjective:  Continues to do well.  No chest pain or dyspnea. Rhythm atrial fib with controlled ventricular response. Afebrile.  Objective:  Vital Signs in the last 24 hours: Temp:  [97.5 F (36.4 C)-98.3 F (36.8 C)] 97.8 F (36.6 C) (08/12 0511) Pulse Rate:  [93-100] 93  (08/12 0511) Resp:  [15-18] 18  (08/12 0511) BP: (108-152)/(57-83) 150/78 mmHg (08/12 0511) SpO2:  [94 %-100 %] 97 % (08/12 0511) Weight:  [209 lb 1.6 oz (94.847 kg)] 209 lb 1.6 oz (94.847 kg) (08/12 0511)  Intake/Output from previous day: 08/11 0701 - 08/12 0700 In: 960 [P.O.:260; I.V.:700] Out: 1025 [Urine:1000; Blood:25]   Physical Exam: General: Well developed, well nourished, in no acute distress. Elderly Head:  Normocephalic and atraumatic.no JVD Lungs: Clear to auscultation and percussion. Heart: Irreg irreg.  3/6 HSM apex,no rubs or gallops.  Abdomen: soft, non-tender, positive bowel sounds. Extremities: Right ankle in ACE wrap.  Mild pedal edema left ankle. Neurologic: Alert and oriented x 3.    Lab Results:  Basename 01/05/12 0432 01/04/12 0607  WBC 8.8 8.2  HGB 9.9* 10.0*  PLT 203 178    Basename 01/05/12 0432 01/04/12 0607  NA 135 133*  K 3.5 3.0*  CL 99 98  CO2 28 27  GLUCOSE 91 91  BUN 11 11  CREATININE 0.67 0.67   No results found for this basename: TROPONINI:2,CK,MB:2 in the last 72 hours Hepatic Function Panel  Basename 01/05/12 0432  PROT 6.0  ALBUMIN 2.7*  AST 15  ALT 12  ALKPHOS 64  BILITOT 0.7  BILIDIR --  IBILI --    Telemetry: AFIB rate <100 Personally viewed.    Assessment/Plan:  Principal Problem:  *Pneumonia Active Problems:  CARDIOMYOPATHY, PRIMARY, DILATED  Atrial fibrillation  DIASTOLIC HEART FAILURE, CHRONIC  Pacemaker  [employer]  Ankle fracture, right  Seizure disorder  Arthritis of knee  1. AFIB - stable. Rate controlled. Coreg 25 bid. ASA only. Not a candidate for coumadin due to subdural in the past.  2. Pacer - normal  function.   3. Chronic Diastolic HF - currently in no distress. Getting IVF. Be careful. Getting furosemide 40 QD. ECHO 2010 with EF 55%. BNP 3931.   4. Hypokalemia - getting KCL PO.    5. Moderate tricuspid and mitral regurgitation.   Overall doing well.  Cassell Clement 01/05/2012, 7:54 AM

## 2012-01-05 NOTE — Progress Notes (Signed)
Clinical Social Work Department BRIEF PSYCHOSOCIAL ASSESSMENT 01/05/2012  Patient:  MONTAVIUS, SUBRAMANIAM     Account Number:  [id number]     Admit date:  01/03/2012  Clinical Social Worker:  Orpah Greek  Date/Time:  01/05/2012 03:24 PM  Referred by:  Physician  Date Referred:  01/05/2012 Referred for  SNF Placement   Other Referral:   Interview type:  Family Other interview type:    PSYCHOSOCIAL DATA Living Status:  WIFE Admitted from facility:   Level of care:   Primary support name:  Keyondre Hepburn (wife) h#: [phone] Primary support relationship to patient:  SPOUSE Degree of support available:   good    CURRENT CONCERNS Current Concerns  Post-Acute Placement   Other Concerns:    SOCIAL WORK ASSESSMENT / PLAN CSW spoke with patient's wife, [name] re: discharge planning. Patient was living at home with his wife prior to hospitalization - PT recommending SNF.   Assessment/plan status:  Information/Referral to [facility] Other assessment/ plan:   Information/referral to community resources:   CSW completed FL2 and faxed information out to [facility].    PATIENT'S/FAMILY'S RESPONSE TO PLAN OF CARE: Wife requesting [facility] as [name] is their medical director - CSW confirmed with [facility] that they will have a bed available at discharge.        Unice Bailey, LCSW [facility] Clinical Social Worker cell #: [phone]

## 2012-01-05 NOTE — Progress Notes (Signed)
   CARE MANAGEMENT NOTE 01/05/2012  Patient:  John [name], John [name]   Account Number:  [id number]  Date Initiated:  01/05/2012  Documentation initiated by:  Jiles Crocker  Subjective/Objective Assessment:   ADMITTED WITH RT ANKLE FRACTURE     Action/Plan:   Primary Physician Minda Meo, MD  Primary Cardiologist Dr. Lafonda Mosses AT HOME WITH SPOUSE, HAS A PERSONAL CARE SERVICE, COMFORT KEEPERS, HOSP BED, WALKER; SOC WORKER REFERRAL FOR POSSIBLE PLACEMENT   Anticipated DC Date:  01/09/2012   Anticipated DC Plan:  SKILLED NURSING FACILITY  In-house referral  Clinical Social Worker      DC Planning Services  CM consult               Status of service:  In process, will continue to follow Medicare Important Message given?  NA - LOS <3 / Initial given by admissions (If response is "NO", the following Medicare IM given date fields will be blank)  Per UR Regulation:  Reviewed for med. necessity/level of care/duration of stay  Comments:  01/05/2012- BCHANDLER RN,BSN, MHA

## 2012-01-06 ENCOUNTER — Encounter (HOSPITAL_COMMUNITY): Payer: Self-pay | Admitting: Orthopaedic Surgery

## 2012-01-06 LAB — COMPREHENSIVE METABOLIC PANEL
ALT: 11 U/L (ref 0–53)
AST: 15 U/L (ref 0–37)
Albumin: 2.7 g/dL — ABNORMAL LOW (ref 3.5–5.2)
CO2: 27 mEq/L (ref 19–32)
Calcium: 8.3 mg/dL — ABNORMAL LOW (ref 8.4–10.5)
Chloride: 97 mEq/L (ref 96–112)
Creatinine, Ser: 0.65 mg/dL (ref 0.50–1.35)
GFR calc non Af Amer: 86 mL/min — ABNORMAL LOW (ref >90)
Sodium: 133 mEq/L — ABNORMAL LOW (ref 135–145)
Total Bilirubin: 0.9 mg/dL (ref 0.3–1.2)

## 2012-01-06 LAB — CBC
Hemoglobin: 10 g/dL — ABNORMAL LOW (ref 13.0–17.0)
MCH: 29.9 pg (ref 26.0–34.0)
MCHC: 33.8 g/dL (ref 30.0–36.0)
MCV: 88.6 fL (ref 78.0–100.0)
RBC: 3.34 MIL/uL — ABNORMAL LOW (ref 4.22–5.81)

## 2012-01-06 LAB — DIFFERENTIAL
Basophils Relative: 0 % (ref 0–1)
Eosinophils Absolute: 0.2 10*3/uL (ref 0.0–0.7)
Eosinophils Relative: 2 % (ref 0–5)
Lymphs Abs: 1.2 10*3/uL (ref 0.7–4.0)
Monocytes Relative: 13 % — ABNORMAL HIGH (ref 3–12)

## 2012-01-06 MED ORDER — POTASSIUM CHLORIDE CRYS ER 20 MEQ PO TBCR
20.0000 meq | EXTENDED_RELEASE_TABLET | Freq: Two times a day (BID) | ORAL | Status: DC
  Administered 2012-01-06: 20 meq via ORAL
  Filled 2012-01-06 (×2): qty 1

## 2012-01-06 MED ORDER — POTASSIUM CHLORIDE CRYS ER 20 MEQ PO TBCR: 20.0000 meq | EXTENDED_RELEASE_TABLET | Freq: Two times a day (BID) | ORAL | Status: DC

## 2012-01-06 MED ORDER — ACETAMINOPHEN 325 MG PO TABS: 650.0000 mg | ORAL_TABLET | Freq: Four times a day (QID) | ORAL | Status: AC | PRN

## 2012-01-06 MED ORDER — ENOXAPARIN SODIUM 40 MG/0.4ML ~~LOC~~ SOLN: 40.0000 mg | Freq: Every day | SUBCUTANEOUS | Status: DC

## 2012-01-06 MED ORDER — DSS 100 MG PO CAPS: 100.0000 mg | ORAL_CAPSULE | Freq: Two times a day (BID) | ORAL | Status: AC

## 2012-01-06 MED ORDER — HYDROCODONE-ACETAMINOPHEN 5-325 MG PO TABS: 1.0000 | ORAL_TABLET | Freq: Four times a day (QID) | ORAL | Status: AC | PRN

## 2012-01-06 MED ORDER — DEXTROSE 5 % IV SOLN: 1.0000 g | INTRAVENOUS | Status: AC

## 2012-01-06 NOTE — Progress Notes (Signed)
Patient is set to discharge to [facility] SNF today. Wife & son at bedside and aware. [facility] called for transport.   Clinical Social Work Department CLINICAL SOCIAL WORK PLACEMENT NOTE 01/06/2012  Patient:  AJAHNI, NAY  Account Number:  [id number] Admit date:  01/03/2012  Clinical Social Worker:  Orpah Greek  Date/time:  01/05/2012 04:30 PM  Clinical Social Work is seeking post-discharge placement for this patient at the following level of care:   SKILLED NURSING   (*CSW will update this form in Epic as items are completed)   01/05/2012  Patient/family provided with [facility] Department of Clinical Social Work's list of facilities offering this level of care within the geographic area requested by the patient (or if unable, by the patient's family).  01/05/2012  Patient/family informed of their freedom to choose among providers that offer the needed level of care, that participate in Medicare, Medicaid or managed care program needed by the patient, have an available bed and are willing to accept the patient.  01/05/2012  Patient/family informed of [facility]' ownership interest in [facility], as well as of the fact that they are under no obligation to receive care at this facility.  PASARR submitted to EDS on 01/05/2012 PASARR number received from EDS on 01/05/2012  FL2 transmitted to all facilities in geographic area requested by pt/family on  01/05/2012 FL2 transmitted to all facilities within larger geographic area on   Patient informed that his/her managed care company has contracts with or will negotiate with  certain facilities, including the following:     Patient/family informed of bed offers received:  01/05/2012 Patient chooses bed at [facility] AND [facility] Physician recommends and patient chooses bed at    Patient to be transferred to [facility] on  01/06/2012 Patient to be transferred to facility by  [facility]  The following physician request were entered in Epic:   Additional Comments:    Unice Bailey, LCSW [facility] Clinical Social Worker cell #: [phone]

## 2012-01-06 NOTE — Progress Notes (Signed)
[place] [facility]  Subjective:  Continues to do well.  No chest pain or dyspnea. Rhythm atrial fib with controlled ventricular response. Afebrile. Wife states he was not able to get out of bed with help yesterday.  Objective:  Vital Signs in the last 24 hours: Temp:  [97.4 F (36.3 C)-98.2 F (36.8 C)] 98.2 F (36.8 C) (08/13 0700) Pulse Rate:  [84-103] 103  (08/13 0700) Resp:  [18] 18  (08/13 0700) BP: (107-158)/(70-88) 158/83 mmHg (08/13 0700) SpO2:  [95 %-99 %] 95 % (08/13 0700) Weight:  [213 lb 6.5 oz (96.8 kg)] 213 lb 6.5 oz (96.8 kg) (08/13 0700)  Intake/Output from previous day: 08/12 0701 - 08/13 0700 In: 483 [P.O.:480; I.V.:3] Out: 1150 [Urine:1150]   Physical Exam: General: Well developed, well nourished, in no acute distress. Elderly Head:  Normocephalic and atraumatic.no JVD Lungs: Clear to auscultation and percussion. Heart: Irreg irreg.  3/6 HSM apex,no rubs or gallops.  Abdomen: soft, non-tender, positive bowel sounds. Extremities: Right ankle in ACE wrap.  Mild pedal edema left ankle. Neurologic: Alert and oriented x 3.    Lab Results:  Basename 01/06/12 0415 01/05/12 0432  WBC 7.8 8.8  HGB 10.0* 9.9*  PLT 211 203    Basename 01/06/12 0415 01/05/12 0432  NA 133* 135  K 3.5 3.5  CL 97 99  CO2 27 28  GLUCOSE 91 91  BUN 10 11  CREATININE 0.65 0.67   No results found for this basename: TROPONINI:2,CK,MB:2 in the last 72 hours Hepatic Function Panel  Basename 01/06/12 0415  PROT 6.2  ALBUMIN 2.7*  AST 15  ALT 11  ALKPHOS 69  BILITOT 0.9  BILIDIR --  IBILI --    Telemetry: Atrial fib with controlled VR around 100   Assessment/Plan:  Principal Problem:  *Pneumonia Active Problems:  CARDIOMYOPATHY, PRIMARY, DILATED  Atrial fibrillation  DIASTOLIC HEART FAILURE, CHRONIC  Pacemaker  [employer]  Ankle fracture, right  Seizure disorder  Arthritis of knee  1. AFIB - stable. Rate controlled. Coreg 25 bid. ASA only. Not a candidate for  coumadin due to subdural in the past.  2. Pacer - normal function.   3. Chronic Diastolic HF - currently in no distress.  ECHO 2010 with EF 55%.    4. Hypokalemia - getting KCL PO.  K is 3.5 borderline low again.  Will increase KDur to BID  5. Moderate tricuspid and mitral regurgitation.   Overall doing well.  For transfer to SNF soon.  Cassell Clement 01/06/2012, 8:11 AM

## 2012-01-06 NOTE — Discharge Summary (Signed)
DISCHARGE SUMMARY  SHAHID FLORI  MR#: [id number]  DOB:November 01, 1923  Date of Admission: 01/03/2012 Date of Discharge: 01/06/2012  Attending Physician:Aasim Restivo,W DOUGLAS  Patient's WUJ:WJXBJYN,WGNFAOZ A, MD  Consults:  Annell Greening, MD- Orthopaedics  Cardiology  Discharge Diagnoses: Principal Problems:   Ankle fracture, right (trimalleolar fracture)  Community Acquired Pneumonia  Active Problems:  CARDIOMYOPATHY, PRIMARY, DILATED  Atrial fibrillation  DIASTOLIC HEART FAILURE, CHRONIC  Pacemaker  [employer]  Arthritis of knee  Past Medical History  Diagnosis Date  . Permanent atrial fibrillation     NOT CANDIDATE FOR COUMADIN  . Bradycardia     AV JUNCTION ABLATION W/RESUMPTION ON CONDUCTION S/P StJUDE PACEMAKER-2011 REVISION/St.JUDE ACCENT SR  . Hypertension   . Subdural hematoma     WHILE ON COUMADIN  . Colitis   . Cardiomyopathy     EF had been 35% (nonischemic, cath 09).  Last echo 2010 EF 55%  . Mitral regurgitation     Moderate  . Arthritis   . Depression   . Congestive heart failure   . Atrial fibrillation   . Vitamin d deficiency     Past Surgical History  Procedure Date  . Subdural hematoma evacuation via craniotomy   . Cystectomy     BREAST, NECK  . Hand surgery   . Insert / replace / remove pacemaker     PACEMAKER IMPLANT 2003-2010.Marland KitchenMarland KitchenREVISION St, JUDE ACCENT SR  . Tonsillectomy and adenoidectomy   . Orif ankle fracture 01/04/2012    Procedure: OPEN REDUCTION INTERNAL FIXATION (ORIF) ANKLE FRACTURE;  Surgeon: Eldred Manges, MD;  Location: [facility];  Service: Orthopedics;  Laterality: Right;  bilateral fixation trimalleolar fixation      Discharge Medications: Medication List  As of 01/06/2012 11:39 AM   STOP taking these medications         HYDROcodone-acetaminophen 7.5-500 MG per tablet         TAKE these medications         acetaminophen 325 MG tablet   Commonly known as: TYLENOL   Take 2 tablets (650 mg total) by mouth every 6 (six) hours  as needed (or Fever >/= 101).      aspirin 81 MG EC tablet   Take 81 mg by mouth daily.      CALCIUM-VITAMIN D PO   Take by mouth daily. Calcium 1200, vit d 1000      carvedilol 25 MG tablet   Commonly known as: COREG   Take 25 mg by mouth 2 (two) times daily with a meal.      dextrose 5 % SOLN 50 mL with cefTRIAXone 1 G SOLR 1 g   Inject 1 g into the vein daily.      diclofenac sodium 1 % Gel   Commonly known as: VOLTAREN   Apply 1 application topically as needed.      DSS 100 MG Caps   Take 100 mg by mouth 2 (two) times daily.      enalapril 10 MG tablet   Commonly known as: VASOTEC   Take 10 mg by mouth daily.      enoxaparin 40 MG/0.4ML injection   Commonly known as: LOVENOX   Inject 0.4 mLs (40 mg total) into the skin daily.      ergocalciferol 50000 UNITS capsule   Commonly known as: VITAMIN D2   Take 50,000 Units by mouth once a week. On Sunday.      escitalopram 10 MG tablet   Commonly known as: LEXAPRO  Take 10 mg by mouth daily.      fish oil-omega-3 fatty acids 1000 MG capsule   Take 1 g by mouth daily.      furosemide 40 MG tablet   Commonly known as: LASIX   Take 40 mg by mouth daily.      HYDROcodone-acetaminophen 5-325 MG per tablet   Commonly known as: NORCO/VICODIN   Take 1 tablet by mouth every 6 (six) hours as needed.      magnesium oxide 400 MG tablet   Commonly known as: MAG-OX   Take 400 mg by mouth daily.      multivitamin tablet   Take 1 tablet by mouth daily.      NEURPATH-B 3-35-2 MG Tabs   Take 1 tablet by mouth at bedtime.      niacin 500 MG tablet   Commonly known as: SLO-NIACIN   Take 500 mg by mouth 2 (two) times daily with a meal.      potassium chloride SA 20 MEQ tablet   Commonly known as: K-DUR,KLOR-CON   Take 1 tablet (20 mEq total) by mouth 2 (two) times daily.            Hospital Procedures: Dg Chest 2 View  01/03/2012  *RADIOLOGY REPORT*  Clinical Data: Cough and shortness of breath.  CHEST - 2 VIEW   Comparison: Chest x-ray 08/05/2011.  Findings: Lung volumes are low.  Right lower lobe airspace consolidation concerning for pneumonia.  Small bilateral pleural effusions.  Pulmonary venous congestion without frank pulmonary edema.  Mild enlargement of the cardiopericardial silhouette. Tortuosity and atherosclerosis of the thoracic aorta.  A left-sided pacemaker device in place with lead tips projecting over the expected location of the right atrium and right ventricular apex.  IMPRESSION: 1.  Findings concerning for right lower lobe pneumonia. 2.  Small bilateral pleural effusions. 3.  Mild cardiomegaly with pulmonary venous congestion, but no frank pulmonary edema.  4.  Atherosclerosis.  Original Report Authenticated By: Florencia Reasons, M.D.   Dg Ankle 2 Views Right  01/04/2012  *RADIOLOGY REPORT*  Clinical Data: ORIF distal fibular and medial malleolar fractures of the right ankle.  RIGHT ANKLE - 2 VIEW  Comparison: Right ankle x-rays yesterday.  Findings: 3 spot images from the C-arm fluoroscopic device, AP and lateral views of the right ankle, were submitted for interpretation post-operatively.  Plate screw fixation of the comminuted distal fibular fracture.  Compression screw fixation of the medial malleolar fracture.  Anatomic alignment.  Intact ankle mortise.  IMPRESSION: Anatomic alignment post ORIF.  Original Report Authenticated By: Arnell Sieving, M.D.   Dg Ankle Complete Right  01/03/2012  *RADIOLOGY REPORT*  Clinical Data: History of fall complaining of ankle pain.  RIGHT ANKLE - COMPLETE 3+ VIEW  Comparison: No priors.  Findings: There is a trimalleolar fracture of the right ankle. There is medial angulation of both the distal fibular fracture and the fracture through the medial malleolus.  Mild posterior displacement of the fracture of the posterior aspect of the distal tibia is noted.  Extensive overlying soft tissue swelling. Visualized portions of the hindfoot and midfoot appear  intact.  IMPRESSION: 1.  Trimalleolar fracture of the right ankle, as detailed above.  Original Report Authenticated By: Florencia Reasons, M.D.   Dg C-arm 1-60 Min-no Report  01/04/2012  CLINICAL DATA: fracture right ankle   C-ARM 1-60 MINUTES  Fluoroscopy was utilized by the requesting physician.  No radiographic  interpretation.      History  of Present Illness (From Dr. Laurey Morale H&P): Tyrease is a pleasant gentleman with several medical problems. He has terrible knee arthritis but has been felt a good surgical candidate. At 11 pm last night. His wife helped him off the toilet and tried to get to bedroom. His leg gave way and he collapsed in the hallway. A strong son lives with him and picked him up and got him to the bed. Today they presented to the ER and has a bad right ankle fracture and he needs admission and care.  No chest pains now or ever. Eating and drinking okay. Appetite not great lately.  Bowel and bladder okay. No blood except occas. Rare nosebleed.  He has had some sob. With exhalation he seems to have exaggerated effort. He is sob with any exertion for 2 weeks or more. January 2013 he was walking with a walker and could get up by himself. He then had three falls with no fractures but he was banged up. He has been in recliner since then really. It takes total assistance to get him up for the last 6 months.   Hospital Course: Given his medical comorbidities Mr. Saiki was admitted to a telemetry bed under the care of the medical service. Chest x-ray on admission showed findings serving for a right lower lobe pneumonia with no pulmonary edema. He was placed on Rocephin IV to cover community acquired pneumonia. Given his extensive cardiac history cardiology was consulted for preoperative evaluation and recommended that the patient proceed with surgical management of his right ankle fracture. He underwent open reduction internal fixation on 8/11 by Dr. Annell Greening. He tolerated this  procedure well with no postoperative complications. He was on Lovenox for DVT prophylaxis which will be continued until he is more mobile. He was continued on his antibiotics postoperatively and continued on his medical management for his chronic congestive heart failure and atrial fibrillation. He had no evidence of significant volume overload. His shortness of breath and cough have improved. Physical therapy/occupational therapy evaluated the patient and recommended skilled nursing facility rehabilitation placement. Orthopedics has recommended that he remain strictly nonweightbearing on the right leg and continue with the splint that is in place. He is stable for discharge to a nursing facility pending bed availability. Given his immobility we'll continue on Lovenox for DVT prophylaxis until he is more mobile. Of note, he is not a candidate for full dose anticoagulation for his atrial fibrillation given a history of subdural hematoma. We'll need to address his osteoporosis management as an outpatient.  Day of Discharge Exam BP 158/83  Pulse 103  Temp 98.2 F (36.8 C) (Oral)  Resp 18  Ht 5\' 8"  (1.727 m)  Wt 96.8 kg (213 lb 6.5 oz)  BMI 32.45 kg/m2  SpO2 95%  Physical Exam: General appearance: Chronically ill in no acute distress Eyes: no scleral icterus Throat: oropharynx moist without erythema Resp: Minimal bibasilar crackles Cardio: Irregularly irregular with grade 3/6 holosystolic murmur over the apex GI: soft, non-tender; bowel sounds normal; no masses,  no organomegaly Extremities: no clubbing, cyanosis or edema.  Right leg splint in place  Discharge Labs:  [facility] 01/06/12 0415 01/05/12 0432  NA 133* 135  K 3.5 3.5  CL 97 99  CO2 27 28  GLUCOSE 91 91  BUN 10 11  CREATININE 0.65 0.67  CALCIUM 8.3* 8.2*  MG -- --  PHOS -- --    Basename 01/06/12 0415 01/05/12 0432  AST 15 15  ALT 11 12  ALKPHOS 69 64  BILITOT 0.9 0.7  PROT 6.2 6.0  ALBUMIN 2.7* 2.7*    Basename  01/06/12 0415 01/05/12 0432  WBC 7.8 8.8  NEUTROABS 5.4 6.3  HGB 10.0* 9.9*  HCT 29.6* 29.4*  MCV 88.6 89.6  PLT 211 203   Lab Results  Component Value Date   INR 1.1 ratio* 05/07/2009   INR 1.1 RATIO* 01/24/2008   No results found for this basename: CKTOTAL:3,CKMB:3,CKMBINDEX:3,TROPONINI:3 in the last 72 hours No results found for this basename: TSH,T4TOTAL,FREET3,T3FREE,THYROIDAB in the last 72 hours No results found for this basename: VITAMINB12:2,FOLATE:2,FERRITIN:2,TIBC:2,IRON:2,RETICCTPCT:2 in the last 72 hours  Discharge instructions: Discharge Orders    Future Appointments: Provider: Department: Dept Phone: Center:   04/05/2012 8:05 AM [phone] [facility]     Future Orders Please Complete By Expires   Diet - low sodium heart healthy      Increase activity slowly      Comments:   Non-weight bearing right leg   Discharge instructions      Comments:   Up with PT. Pt to continue strict non weight bearing to right leg.  Elevation at rest.  Move toes frequently.  Ice packs as needed. Keep splint dry and clean at all times   Leave splint on - Keep it clean, dry, and intact until clinic visit         Disposition: To [facility] skilled nursing and rehabilitation  Follow-up Appts: Follow-up with [name] at [facility] one week after discharge from skilled nursing facility.  Call for appointment. Followup with Dr. Annell Greening at [facility] in 2 weeks.  Condition on Discharge: Stable  Tests Needing Follow-up: None-we'll need to address his osteoporosis management as an outpatient.  Time with discharge activities: 45 minutes  Signed: Ravan Schlemmer,W DOUGLAS 01/06/2012, 11:39 AM

## 2012-01-06 NOTE — Progress Notes (Signed)
Subjective: 2 Days Post-Op Procedure(s) (LRB): OPEN REDUCTION INTERNAL FIXATION (ORIF) ANKLE FRACTURE (Right) Patient reports pain as mild.   Resting comfortably this am. Wife reports she will be visiting [facility] facility today in anticipation for discharge tomorrow. Objective: Vital signs in last 24 hours: Temp:  [97.4 F (36.3 C)-98.2 F (36.8 C)] 98.2 F (36.8 C) (08/13 0700) Pulse Rate:  [84-103] 103  (08/13 0700) Resp:  [18] 18  (08/13 0700) BP: (107-158)/(70-88) 158/83 mmHg (08/13 0700) SpO2:  [95 %-99 %] 95 % (08/13 0700) Weight:  [96.8 kg (213 lb 6.5 oz)] 96.8 kg (213 lb 6.5 oz) (08/13 0700)  Intake/Output from previous day: 08/12 0701 - 08/13 0700 In: 483 [P.O.:480; I.V.:3] Out: 1150 [Urine:1150] Intake/Output this shift: Total I/O In: -  Out: 425 [Urine:425]   Basename 01/06/12 0415 01/05/12 0432 01/04/12 0607 01/03/12 1150  HGB 10.0* 9.9* 10.0* 10.7*    Basename 01/06/12 0415 01/05/12 0432  WBC 7.8 8.8  RBC 3.34* 3.28*  HCT 29.6* 29.4*  PLT 211 203    Basename 01/06/12 0415 01/05/12 0432  NA 133* 135  K 3.5 3.5  CL 97 99  CO2 27 28  BUN 10 11  CREATININE 0.65 0.67  GLUCOSE 91 91  CALCIUM 8.3* 8.2*   No results found for this basename: LABPT:2,INR:2 in the last 72 hours  Sensation intact distally cap refill of toes intact and splint intact.  Assessment/Plan: 2 Days Post-Op Procedure(s) (LRB): OPEN REDUCTION INTERNAL FIXATION (ORIF) ANKLE FRACTURE (Right) Up with therapy Discharge to SNF when medically stable. Pt to continue strict non weight bearing to right leg.  Elevation at rest.  Move toes frequently.  Ice packs as needed. Keep splint dry and clean at all times OV 2 weeks with Dr Ophelia Charter. May continue on full strength aspirin daily for DVT prophylaxis at discharge   Kymari Lollis M 01/06/2012, 9:34 AM

## 2012-01-13 ENCOUNTER — Other Ambulatory Visit (HOSPITAL_COMMUNITY): Payer: Self-pay | Admitting: Orthopaedic Surgery

## 2012-01-13 ENCOUNTER — Encounter (HOSPITAL_COMMUNITY): Payer: Self-pay | Admitting: *Deleted

## 2012-01-13 DIAGNOSIS — J189 Pneumonia, unspecified organism: Secondary | ICD-10-CM

## 2012-01-13 HISTORY — DX: Pneumonia, unspecified organism: J18.9

## 2012-01-13 MED ORDER — CEFAZOLIN SODIUM-DEXTROSE 2-3 GM-% IV SOLR
2.0000 g | INTRAVENOUS | Status: AC
  Administered 2012-01-14: 2 g via INTRAVENOUS
  Filled 2012-01-13: qty 50

## 2012-01-13 NOTE — Progress Notes (Signed)
ICD form faxed to [facility]

## 2012-01-14 ENCOUNTER — Encounter (HOSPITAL_COMMUNITY): Payer: Self-pay | Admitting: Critical Care Medicine

## 2012-01-14 ENCOUNTER — Inpatient Hospital Stay (HOSPITAL_COMMUNITY)
Admission: RE | Admit: 2012-01-14 | Discharge: 2012-01-15 | DRG: 492 | Disposition: A | Discharge status: Skilled Nursing Facility | Payer: Medicare Other | Source: Ambulatory Visit | Attending: Orthopaedic Surgery | Admitting: Orthopaedic Surgery

## 2012-01-14 ENCOUNTER — Other Ambulatory Visit (HOSPITAL_COMMUNITY): Payer: Self-pay | Admitting: Orthopaedic Surgery

## 2012-01-14 ENCOUNTER — Ambulatory Visit (HOSPITAL_COMMUNITY): Payer: Medicare Other

## 2012-01-14 ENCOUNTER — Ambulatory Visit (HOSPITAL_COMMUNITY): Payer: Medicare Other | Admitting: Critical Care Medicine

## 2012-01-14 ENCOUNTER — Encounter (HOSPITAL_COMMUNITY)
Admission: RE | Disposition: A | Discharge status: Skilled Nursing Facility | Payer: Self-pay | Source: Ambulatory Visit | Attending: Orthopaedic Surgery

## 2012-01-14 DIAGNOSIS — S82891A Other fracture of right lower leg, initial encounter for closed fracture: Secondary | ICD-10-CM | POA: Diagnosis present

## 2012-01-14 DIAGNOSIS — Z806 Family history of leukemia: Secondary | ICD-10-CM

## 2012-01-14 DIAGNOSIS — Z8781 Personal history of (healed) traumatic fracture: Secondary | ICD-10-CM

## 2012-01-14 DIAGNOSIS — Y831 Surgical operation with implant of artificial internal device as the cause of abnormal reaction of the patient, or of later complication, without mention of misadventure at the time of the procedure: Secondary | ICD-10-CM | POA: Diagnosis present

## 2012-01-14 DIAGNOSIS — Z8249 Family history of ischemic heart disease and other diseases of the circulatory system: Secondary | ICD-10-CM

## 2012-01-14 DIAGNOSIS — I428 Other cardiomyopathies: Secondary | ICD-10-CM | POA: Diagnosis present

## 2012-01-14 DIAGNOSIS — F329 Major depressive disorder, single episode, unspecified: Secondary | ICD-10-CM | POA: Diagnosis present

## 2012-01-14 DIAGNOSIS — J189 Pneumonia, unspecified organism: Secondary | ICD-10-CM | POA: Diagnosis present

## 2012-01-14 DIAGNOSIS — Y92009 Unspecified place in unspecified non-institutional (private) residence as the place of occurrence of the external cause: Secondary | ICD-10-CM

## 2012-01-14 DIAGNOSIS — I059 Rheumatic mitral valve disease, unspecified: Secondary | ICD-10-CM | POA: Diagnosis present

## 2012-01-14 DIAGNOSIS — Z950 Presence of cardiac pacemaker: Secondary | ICD-10-CM

## 2012-01-14 DIAGNOSIS — Z888 Allergy status to other drugs, medicaments and biological substances status: Secondary | ICD-10-CM

## 2012-01-14 DIAGNOSIS — I10 Essential (primary) hypertension: Secondary | ICD-10-CM | POA: Diagnosis present

## 2012-01-14 DIAGNOSIS — Z823 Family history of stroke: Secondary | ICD-10-CM

## 2012-01-14 DIAGNOSIS — I4891 Unspecified atrial fibrillation: Secondary | ICD-10-CM | POA: Diagnosis present

## 2012-01-14 DIAGNOSIS — T84498A Other mechanical complication of other internal orthopedic devices, implants and grafts, initial encounter: Principal | ICD-10-CM | POA: Diagnosis present

## 2012-01-14 DIAGNOSIS — F3289 Other specified depressive episodes: Secondary | ICD-10-CM | POA: Diagnosis present

## 2012-01-14 HISTORY — DX: Pneumonia, unspecified organism: J18.9

## 2012-01-14 HISTORY — DX: Presence of cardiac pacemaker: Z95.0

## 2012-01-14 HISTORY — PX: ORIF ANKLE FRACTURE: SHX5408

## 2012-01-14 LAB — CBC
MCHC: 33 g/dL (ref 30.0–36.0)
Platelets: 301 10*3/uL (ref 150–400)
RDW: 13.5 % (ref 11.5–15.5)
WBC: 7 10*3/uL (ref 4.0–10.5)

## 2012-01-14 LAB — APTT: aPTT: 32 seconds (ref 24–37)

## 2012-01-14 LAB — PROTIME-INR
INR: 1.37 (ref 0.00–1.49)
Prothrombin Time: 17.1 seconds — ABNORMAL HIGH (ref 11.6–15.2)

## 2012-01-14 SURGERY — OPEN REDUCTION INTERNAL FIXATION (ORIF) ANKLE FRACTURE
Anesthesia: Spinal | Site: Ankle | Laterality: Right | Wound class: Clean

## 2012-01-14 MED ORDER — METHOCARBAMOL 500 MG PO TABS: 500.0000 mg | ORAL_TABLET | Freq: Four times a day (QID) | ORAL | Status: AC | PRN

## 2012-01-14 MED ORDER — HYDROMORPHONE HCL PF 1 MG/ML IJ SOLN
0.2500 mg | INTRAMUSCULAR | Status: DC | PRN
  Administered 2012-01-14: 0.5 mg via INTRAVENOUS
  Filled 2012-01-14 (×2): qty 1

## 2012-01-14 MED ORDER — KCL IN DEXTROSE-NACL 20-5-0.45 MEQ/L-%-% IV SOLN
INTRAVENOUS | Status: DC
  Administered 2012-01-14: 17:00:00 via INTRAVENOUS
  Filled 2012-01-14 (×2): qty 1000

## 2012-01-14 MED ORDER — METOCLOPRAMIDE HCL 5 MG/ML IJ SOLN: 5.0000 mg | Freq: Three times a day (TID) | INTRAMUSCULAR | Status: DC | PRN

## 2012-01-14 MED ORDER — ONDANSETRON HCL 4 MG/2ML IJ SOLN
INTRAMUSCULAR | Status: DC | PRN
  Administered 2012-01-14: 4 mg via INTRAVENOUS

## 2012-01-14 MED ORDER — CEFAZOLIN SODIUM-DEXTROSE 2-3 GM-% IV SOLR
2.0000 g | Freq: Four times a day (QID) | INTRAVENOUS | Status: AC
  Administered 2012-01-14 – 2012-01-15 (×2): 2 g via INTRAVENOUS
  Filled 2012-01-14 (×3): qty 50

## 2012-01-14 MED ORDER — METHOCARBAMOL 500 MG PO TABS
500.0000 mg | ORAL_TABLET | Freq: Four times a day (QID) | ORAL | Status: DC | PRN
  Administered 2012-01-15: 500 mg via ORAL
  Filled 2012-01-14: qty 1

## 2012-01-14 MED ORDER — ONDANSETRON HCL 4 MG PO TABS: 4.0000 mg | ORAL_TABLET | Freq: Four times a day (QID) | ORAL | Status: DC | PRN

## 2012-01-14 MED ORDER — 0.9 % SODIUM CHLORIDE (POUR BTL) OPTIME
TOPICAL | Status: DC | PRN
  Administered 2012-01-14: 1000 mL

## 2012-01-14 MED ORDER — FENTANYL CITRATE 0.05 MG/ML IJ SOLN
INTRAMUSCULAR | Status: DC | PRN
  Administered 2012-01-14: 10 ug via INTRAVENOUS

## 2012-01-14 MED ORDER — ONDANSETRON HCL 4 MG/2ML IJ SOLN: 4.0000 mg | Freq: Four times a day (QID) | INTRAMUSCULAR | Status: DC | PRN

## 2012-01-14 MED ORDER — OXYCODONE-ACETAMINOPHEN 5-325 MG PO TABS
1.0000 | ORAL_TABLET | ORAL | Status: DC | PRN
  Administered 2012-01-14 – 2012-01-15 (×3): 1 via ORAL
  Filled 2012-01-14 (×2): qty 1
  Filled 2012-01-14: qty 2

## 2012-01-14 MED ORDER — METOCLOPRAMIDE HCL 10 MG PO TABS: 5.0000 mg | ORAL_TABLET | Freq: Three times a day (TID) | ORAL | Status: DC | PRN

## 2012-01-14 MED ORDER — BISACODYL 10 MG RE SUPP: 10.0000 mg | Freq: Every day | RECTAL | Status: DC | PRN

## 2012-01-14 MED ORDER — KCL IN DEXTROSE-NACL 20-5-0.45 MEQ/L-%-% IV SOLN
INTRAVENOUS | Status: AC
  Filled 2012-01-14: qty 1000

## 2012-01-14 MED ORDER — PROPOFOL 10 MG/ML IV EMUL
INTRAVENOUS | Status: DC | PRN
  Administered 2012-01-14: 50 ug/kg/min via INTRAVENOUS

## 2012-01-14 MED ORDER — LACTATED RINGERS IV SOLN
INTRAVENOUS | Status: DC | PRN
  Administered 2012-01-14: 12:00:00 via INTRAVENOUS

## 2012-01-14 MED ORDER — BUPIVACAINE HCL (PF) 0.25 % IJ SOLN
INTRAMUSCULAR | Status: AC
  Filled 2012-01-14: qty 30

## 2012-01-14 MED ORDER — OXYCODONE-ACETAMINOPHEN 5-325 MG PO TABS: 1.0000 | ORAL_TABLET | ORAL | Status: AC | PRN

## 2012-01-14 MED ORDER — METHOCARBAMOL 100 MG/ML IJ SOLN
500.0000 mg | Freq: Four times a day (QID) | INTRAVENOUS | Status: DC | PRN
  Filled 2012-01-14: qty 5

## 2012-01-14 SURGICAL SUPPLY — 72 items
BANDAGE ELASTIC 4 VELCRO ST LF (GAUZE/BANDAGES/DRESSINGS) ×1 IMPLANT
BANDAGE ELASTIC 6 VELCRO ST LF (GAUZE/BANDAGES/DRESSINGS) ×1 IMPLANT
BANDAGE ESMARK 6X9 LF (GAUZE/BANDAGES/DRESSINGS) IMPLANT
BANDAGE GAUZE ELAST BULKY 4 IN (GAUZE/BANDAGES/DRESSINGS) ×1 IMPLANT
BIT DRILL 2.5X110 QC LCP DISP (BIT) ×1 IMPLANT
BIT DRILL 2.8 (BIT) ×1
BIT DRILL CANN QC 2.8X165 (BIT) IMPLANT
BNDG CMPR 9X6 STRL LF SNTH (GAUZE/BANDAGES/DRESSINGS)
BNDG ESMARK 6X9 LF (GAUZE/BANDAGES/DRESSINGS)
CLOTH BEACON ORANGE TIMEOUT ST (SAFETY) ×2 IMPLANT
COVER MAYO STAND STRL (DRAPES) ×2 IMPLANT
COVER SURGICAL LIGHT HANDLE (MISCELLANEOUS) ×2 IMPLANT
CUFF TOURNIQUET SINGLE 34IN LL (TOURNIQUET CUFF) IMPLANT
CUFF TOURNIQUET SINGLE 44IN (TOURNIQUET CUFF) IMPLANT
DRAPE C-ARM 42X72 X-RAY (DRAPES) IMPLANT
DRAPE INCISE IOBAN 66X45 STRL (DRAPES) ×2 IMPLANT
DRAPE PROXIMA HALF (DRAPES) ×2 IMPLANT
DRAPE U-SHAPE 47X51 STRL (DRAPES) ×2 IMPLANT
DRILL BIT 2.8MM (BIT) ×2
DRSG PAD ABDOMINAL 8X10 ST (GAUZE/BANDAGES/DRESSINGS) ×2 IMPLANT
DURAPREP 26ML APPLICATOR (WOUND CARE) ×2 IMPLANT
ELECT REM PT RETURN 9FT ADLT (ELECTROSURGICAL) ×2
ELECTRODE REM PT RTRN 9FT ADLT (ELECTROSURGICAL) ×1 IMPLANT
GAUZE XEROFORM 5X9 LF (GAUZE/BANDAGES/DRESSINGS) ×2 IMPLANT
GLOVE BIOGEL PI IND STRL 7.5 (GLOVE) ×1 IMPLANT
GLOVE BIOGEL PI IND STRL 8 (GLOVE) ×1 IMPLANT
GLOVE BIOGEL PI INDICATOR 7.5 (GLOVE) ×1
GLOVE BIOGEL PI INDICATOR 8 (GLOVE) ×1
GLOVE ECLIPSE 7.0 STRL STRAW (GLOVE) ×2 IMPLANT
GLOVE ORTHO TXT STRL SZ7.5 (GLOVE) ×2 IMPLANT
GOWN PREVENTION PLUS LG XLONG (DISPOSABLE) IMPLANT
GOWN PREVENTION PLUS XLARGE (GOWN DISPOSABLE) ×2 IMPLANT
GOWN STRL NON-REIN LRG LVL3 (GOWN DISPOSABLE) ×4 IMPLANT
K-WIRE 2.0X150M (WIRE) ×4
KIT 1/3 TUB PL 7H 85M (Orthopedic Implant) ×1 IMPLANT
KIT BASIN OR (CUSTOM PROCEDURE TRAY) ×2 IMPLANT
KIT ROOM TURNOVER OR (KITS) ×2 IMPLANT
KWIRE 2.0X150M (WIRE) IMPLANT
MANIFOLD NEPTUNE II (INSTRUMENTS) ×2 IMPLANT
NS IRRIG 1000ML POUR BTL (IV SOLUTION) ×2 IMPLANT
PACK ORTHO EXTREMITY (CUSTOM PROCEDURE TRAY) ×2 IMPLANT
PAD ARMBOARD 7.5X6 YLW CONV (MISCELLANEOUS) ×4 IMPLANT
PAD CAST 4YDX4 CTTN HI CHSV (CAST SUPPLIES) ×1 IMPLANT
PADDING CAST COTTON 4X4 STRL (CAST SUPPLIES) ×2
PADDING CAST COTTON 6X4 STRL (CAST SUPPLIES) ×2 IMPLANT
PROS 1/3 TUB PL 7H 85M (Orthopedic Implant) ×2 IMPLANT
SCREW CANC FT 12 4.0 (Screw) ×2 IMPLANT
SCREW CANC PT 4.0X50 (Screw) ×2 IMPLANT
SCREW CORTEX 3.5 50MM (Screw) ×1 IMPLANT
SCREW CORTEX 3.5 55MM (Screw) ×3 IMPLANT
SCREW LOCK T15 FT 12X3.5X2.9X (Screw) IMPLANT
SCREW LOCK T15 FT 14X3.5X2.9X (Screw) IMPLANT
SCREW LOCK T15 FT 16X3.5X2.9X (Screw) IMPLANT
SCREW LOCK T15 FT 18X3.5X2.9X (Screw) IMPLANT
SCREW LOCKING 3.5X12 (Screw) ×4 IMPLANT
SCREW LOCKING 3.5X14 (Screw) ×2 IMPLANT
SCREW LOCKING 3.5X16 (Screw) ×6 IMPLANT
SCREW LOCKING 3.5X18 (Screw) ×2 IMPLANT
SPLINT FIBERGLASS 4X30 (CAST SUPPLIES) ×2 IMPLANT
SPONGE GAUZE 4X4 12PLY (GAUZE/BANDAGES/DRESSINGS) ×2 IMPLANT
SPONGE LAP 18X18 X RAY DECT (DISPOSABLE) ×2 IMPLANT
STAPLER VISISTAT 35W (STAPLE) IMPLANT
SUCTION FRAZIER TIP 10 FR DISP (SUCTIONS) ×2 IMPLANT
SUT ETHILON 2 0 FS 18 (SUTURE) ×4 IMPLANT
SUT ETHILON 3 0 PS 1 (SUTURE) ×2 IMPLANT
SUT VIC AB 2-0 CT1 27 (SUTURE) ×4
SUT VIC AB 2-0 CT1 TAPERPNT 27 (SUTURE) ×2 IMPLANT
TOWEL OR 17X24 6PK STRL BLUE (TOWEL DISPOSABLE) ×2 IMPLANT
TOWEL OR 17X26 10 PK STRL BLUE (TOWEL DISPOSABLE) ×2 IMPLANT
TUBE CONNECTING 12X1/4 (SUCTIONS) ×2 IMPLANT
WATER STERILE IRR 1000ML POUR (IV SOLUTION) ×2 IMPLANT
YANKAUER SUCT BULB TIP NO VENT (SUCTIONS) ×2 IMPLANT

## 2012-01-14 NOTE — Plan of Care (Signed)
Problem: Diagnosis - Type of Surgery Goal: General Surgical Patient Education (See Patient Education module for education specifics) Right ankle ORIF

## 2012-01-14 NOTE — Progress Notes (Signed)
Call to Device clinic, spoke with Amber, she will look up order & fax back to [facility] shortly

## 2012-01-14 NOTE — Transfer of Care (Signed)
Immediate Anesthesia Transfer of Care Note  Patient: John [name]  Procedure(s) Performed: Procedure(s) (LRB): OPEN REDUCTION INTERNAL FIXATION (ORIF) ANKLE FRACTURE (Right)  Patient Location: PACU  Anesthesia Type: Spinal  Level of Consciousness: awake, alert  and oriented  Airway & Oxygen Therapy: Patient Spontanous Breathing and Patient connected to nasal cannula oxygen  Post-op Assessment: Report given to PACU RN and Post -op Vital signs reviewed and stable  Post vital signs: Reviewed and stable  Complications: No apparent anesthesia complications

## 2012-01-14 NOTE — H&P (Signed)
John [name] is an 78 y.o. male.   Chief Complaint:  Right ankle ORIF loss of fixation and ankle subluxation  HPI: 78yo with ORIF bimalleolar fixation of trimalleolar Fx,  Reported to be NWB but has been putting some weight on his foot with anterior subluxation of ankle joint and loss of fixation of MM screws.   Past Medical History  Diagnosis Date  . Permanent atrial fibrillation     NOT CANDIDATE FOR COUMADIN  . Bradycardia     AV JUNCTION ABLATION W/RESUMPTION ON CONDUCTION S/P StJUDE PACEMAKER-2011 REVISION/St.JUDE ACCENT SR  . Hypertension   . Subdural hematoma 2003    bilateral, WHILE ON COUMADIN  . Colitis   . Cardiomyopathy     EF had been 35% (nonischemic, cath 09).  Last echo 2010 EF 55%  . Mitral regurgitation     Moderate  . Arthritis   . Depression   . Congestive heart failure   . Atrial fibrillation   . Vitamin d deficiency   . Pneumonia 01/13/12    being treated for pneumonia  . Pacemaker     Past Surgical History  Procedure Date  . Subdural hematoma evacuation via craniotomy   . Cystectomy     BREAST, NECK  . Hand surgery   . Insert / replace / remove pacemaker     PACEMAKER IMPLANT 2003-2010.Marland KitchenMarland KitchenREVISION St, JUDE ACCENT SR  . Tonsillectomy and adenoidectomy   . Orif ankle fracture 01/04/2012    Procedure: OPEN REDUCTION INTERNAL FIXATION (ORIF) ANKLE FRACTURE;  Surgeon: John Manges, MD;  Location: [facility];  Service: Orthopedics;  Laterality: Right;  bilateral fixation trimalleolar fixation    Family History  Problem Relation Age of Onset  . Leukemia Father   . Heart disease Father     angina  . Stroke Mother    Social History:  reports that he has quit smoking. His smoking use included Cigarettes. He does not have any smokeless tobacco history on file. He reports that he does not drink alcohol or use illicit drugs.  Allergies:  Allergies  Allergen Reactions  . Celecoxib Other (See Comments)    Stomach bleed  . Warfarin Sodium     Causes heart  rate, pace maker  . Zaroxolyn (Metolazone)     seizure    Medications Prior to Admission  Medication Sig Dispense Refill  . acetaminophen (TYLENOL) 325 MG tablet Take 2 tablets (650 mg total) by mouth every 6 (six) hours as needed (or Fever >/= 101).      Marland Kitchen aspirin 81 MG EC tablet Take 81 mg by mouth daily.        Marland Kitchen CALCIUM-VITAMIN D PO Take by mouth daily. Calcium 1200, vit d 1000       . carvedilol (COREG) 25 MG tablet Take 25 mg by mouth 2 (two) times daily with a meal.       . docusate sodium 100 MG CAPS Take 100 mg by mouth 2 (two) times daily.      . enalapril (VASOTEC) 10 MG tablet Take 10 mg by mouth daily.        Marland Kitchen enoxaparin (LOVENOX) 40 MG/0.4ML injection Inject 0.4 mLs (40 mg total) into the skin daily.  0 Syringe    . ergocalciferol (VITAMIN D2) 50000 UNITS capsule Take 50,000 Units by mouth once a week. On Sunday.      . escitalopram (LEXAPRO) 10 MG tablet Take 10 mg by mouth daily.       . fish oil-omega-3  fatty acids 1000 MG capsule Take 1 g by mouth daily.       . furosemide (LASIX) 40 MG tablet Take 40 mg by mouth daily.      Marland Kitchen HYDROcodone-acetaminophen (NORCO/VICODIN) 5-325 MG per tablet Take 1 tablet by mouth every 6 (six) hours as needed.  30 tablet  0  . L-Methylfolate-B6-B12 (NEURPATH-B) 3-35-2 MG TABS Take 1 tablet by mouth at bedtime.      . magnesium oxide (MAG-OX) 400 MG tablet Take 400 mg by mouth daily.      . Multiple Vitamin (MULTIVITAMIN) tablet Take 1 tablet by mouth daily.        . niacin (SLO-NIACIN) 500 MG tablet Take 500 mg by mouth 2 (two) times daily with a meal.      . potassium chloride SA (K-DUR,KLOR-CON) 20 MEQ tablet Take 1 tablet (20 mEq total) by mouth 2 (two) times daily.  60 tablet  1    Results for orders placed during the hospital encounter of 01/14/12 (from the past 48 hour(s))  APTT     Status: Normal   Collection Time   01/14/12 11:18 AM      Component Value Range Comment   aPTT 32  24 - 37 seconds   CBC     Status: Abnormal    Collection Time   01/14/12 11:18 AM      Component Value Range Comment   WBC 7.0  4.0 - 10.5 K/uL    RBC 3.75 (*) 4.22 - 5.81 MIL/uL    Hemoglobin 11.0 (*) 13.0 - 17.0 g/dL    HCT 16.1 (*) 09.6 - 52.0 %    MCV 88.8  78.0 - 100.0 fL    MCH 29.3  26.0 - 34.0 pg    MCHC 33.0  30.0 - 36.0 g/dL    RDW 04.5  40.9 - 81.1 %    Platelets 301  150 - 400 K/uL   PROTIME-INR     Status: Abnormal   Collection Time   01/14/12 11:18 AM      Component Value Range Comment   Prothrombin Time 17.1 (*) 11.6 - 15.2 seconds    INR 1.37  0.00 - 1.49    No results found.  Review of Systems  Constitutional: Positive for malaise/fatigue. Negative for fever.       Recent pneumonia  HENT: Negative.   Respiratory:       Pneumonia finishing ABX treatment with 2 days left  Cardiovascular:       Ejection fraction 35%,   Gastrointestinal: Negative.   Musculoskeletal: Positive for joint pain.  Skin: Negative for itching and rash.  Neurological: Positive for focal weakness and weakness.  Psychiatric/Behavioral: Negative.     Blood pressure 127/78, pulse 91, temperature 97.8 F (36.6 C), temperature source Oral, resp. rate 18, SpO2 97.00%. Physical Exam  Constitutional:       Elderly , alert , poor health appearance  HENT:  Head: Atraumatic.  Eyes: Pupils are equal, round, and reactive to light.  Neck: No tracheal deviation present.  Cardiovascular: Normal rate.   Respiratory: No stridor. He has wheezes. He has no rales. He exhibits no tenderness.  GI: Soft.  Musculoskeletal:       Right ankle deformity  Neurological: He is alert.  Skin: Skin is warm and dry.  Psychiatric: He has a normal mood and affect. His behavior is normal. Thought content normal.     Assessment/Plan Right ankle FX revision surgery.     John [name] C  01/14/2012, 12:33 PM

## 2012-01-14 NOTE — Interval H&P Note (Signed)
History and Physical Interval Note:  01/14/2012 12:39 PM  John [name]  has presented today for surgery, with the diagnosis of Right ankle fracture malalignment  The various methods of treatment have been discussed with the patient and family. After consideration of risks, benefits and other options for treatment, the patient has consented to  Procedure(s) (LRB): OPEN REDUCTION INTERNAL FIXATION (ORIF) ANKLE FRACTURE (Right) as a surgical intervention .  The patient's history has been reviewed, patient examined, no change in status, stable for surgery.  I have reviewed the patient's chart and labs.  Questions were answered to the patient's satisfaction.     Ardean Melroy C

## 2012-01-14 NOTE — Anesthesia Preprocedure Evaluation (Addendum)
Anesthesia Evaluation  Patient identified by MRN, date of birth, ID band Patient awake    Reviewed: Allergy & Precautions, H&P , NPO status , Patient's Chart, lab work & pertinent test results  Airway Mallampati: II TM Distance: >3 FB Neck ROM: Full    Dental  (+) Dental Advisory Given, Poor Dentition and Loose,    Pulmonary pneumonia -,          Cardiovascular hypertension, +CHF + dysrhythmias Atrial Fibrillation + pacemaker     Neuro/Psych PSYCHIATRIC DISORDERS Depression    GI/Hepatic   Endo/Other    Renal/GU      Musculoskeletal   Abdominal   Peds  Hematology   Anesthesia Other Findings   Reproductive/Obstetrics                          Anesthesia Physical Anesthesia Plan  ASA: III  Anesthesia Plan: Spinal   Post-op Pain Management:    Induction:   Airway Management Planned: Simple Face Mask  Additional Equipment:   Intra-op Plan:   Post-operative Plan: Extubation in OR  Informed Consent: I have reviewed the patients History and Physical, chart, labs and discussed the procedure including the risks, benefits and alternatives for the proposed anesthesia with the patient or authorized representative who has indicated his/her understanding and acceptance.   Dental advisory given  Plan Discussed with: Anesthesiologist and Surgeon  Anesthesia Plan Comments:        Anesthesia Quick Evaluation

## 2012-01-14 NOTE — Preoperative (Signed)
Beta Blockers   Reason not to administer Beta Blockers:Not Applicable, pt took 8/21 

## 2012-01-14 NOTE — Progress Notes (Signed)
Call from [facility]- saying that the necessary order for pacemaker will be "put in system asap".

## 2012-01-14 NOTE — Brief Op Note (Addendum)
01/14/2012  2:47 PM  PATIENT:  John [name]  78 y.o. male  PRE-OPERATIVE DIAGNOSIS:  Right ankle fracture malalignment with loss of fixation and ankle subluxation  POST-OPERATIVE DIAGNOSIS: same  PROCEDURE:  Procedure(s) (LRB): OPEN REDUCTION INTERNAL FIXATION (ORIF) ANKLE FRACTURE (Right) revision.  Fixation medial , lateral and posterior malleolus  SURGEON:  Surgeon(s) and Role:    * Eldred Manges, MD - Primary  PHYSICIAN ASSISTANT:   ASSISTANTS: none   ANESTHESIA:   spinal  EBL:  Total I/O In: 500 [I.V.:500] Out: 25 [Blood:25]  BLOOD ADMINISTERED:none  DRAINS: none   LOCAL MEDICATIONS USED:  NONE  SPECIMEN:  No Specimen  DISPOSITION OF SPECIMEN:  N/A  COUNTS:  YES  TOURNIQUET:   Total Tourniquet Time Documented: Thigh (Right) - 66 minutes  DICTATION: .Other Dictation: Dictation Number [id number]  PLAN OF CARE: Admit to inpatient   PATIENT DISPOSITION:  PACU - hemodynamically stable.   Delay start of Pharmacological VTE agent (>24hrs) due to surgical blood loss or risk of bleeding: no heparin or coumadin hx epidural bleed on therap utic   coumadin

## 2012-01-14 NOTE — Anesthesia Postprocedure Evaluation (Signed)
Anesthesia Post Note  Patient: John [name]  Procedure(s) Performed: Procedure(s) (LRB): OPEN REDUCTION INTERNAL FIXATION (ORIF) ANKLE FRACTURE (Right)  Anesthesia type: SAB  Patient location: PACU  Post pain: Pain level controlled and Adequate analgesia  Post assessment: Post-op Vital signs reviewed, Patient's Cardiovascular Status Stable, Respiratory Function Stable, Patent Airway and Pain level controlled  Last Vitals:  Filed Vitals:   01/14/12 1515  BP: 142/87  Pulse:   Temp:   Resp:     Post vital signs: Reviewed and stable  Level of consciousness: awake, alert  and oriented  Complications: No apparent anesthesia complications

## 2012-01-14 NOTE — Anesthesia Procedure Notes (Addendum)
Spinal  Patient location during procedure: OR Start time: 01/14/2012 12:44 PM End time: 01/14/2012 12:52 PM Staffing Anesthesiologist: Remonia Richter Performed by: anesthesiologist  Preanesthetic Checklist Completed: patient identified, site marked, surgical consent, pre-op evaluation, timeout performed, IV checked, risks and benefits discussed and monitors and equipment checked Spinal Block Patient position: sitting Prep: Betasept Patient monitoring: heart rate, cardiac monitor, continuous pulse ox and blood pressure Approach: midline Location: L3-4 Injection technique: single-shot Needle Needle type: Quincke  Needle gauge: 22 G Needle length: 9 cm  Procedure Name: MAC Date/Time: 01/14/2012 12:44 PM Performed by: Elon Alas Pre-anesthesia Checklist: Patient identified, Timeout performed, Emergency Drugs available, Suction available and Patient being monitored Oxygen Delivery Method: Simple face mask Preoxygenation: Pre-oxygenation with 100% oxygen Placement Confirmation: breath sounds checked- equal and bilateral and positive ETCO2 Dental Injury: Teeth and Oropharynx as per pre-operative assessment

## 2012-01-14 NOTE — Progress Notes (Addendum)
Patient with single chamber [employer] Jude pacemaker.  Patient is not dependent.  Only V paces 26% of the time.  Last remote check earlier this month with normal device function.  Pt scheduled for right ankle ORIF today.  Per protocol, patient does not need any intervention with his device.  There is no need for magnet, reprogramming, or check post surgery while in hospital.    We did not receive fax asking for device management orders, hence am leaving note in EPIC.  Please call with questions.   Gypsy Balsam, RN, BSN, CCDS [employer] [phone]  Agree Sherryl Manges, MD 01/14/2012 10:50 AM

## 2012-01-14 NOTE — Progress Notes (Signed)
Call to Dr. Krista Blue, reviewed pt. Per Dr. Krista Blue, omit redraw on CMET & CXR.  Informed that pt. is on continuous O2- 3liters per min & has pacemaker. Device order on chart.

## 2012-01-14 NOTE — Progress Notes (Signed)
Call to [name], due to pt. remarking that he had an ankle xray yesterday at [name]' office & doesn't want it repeated today .  [name] office states, ( spoke with John [name]), that the xray must be repeated today. Pt. Informed of the same.

## 2012-01-14 NOTE — Progress Notes (Signed)
Call to [facility], spoke with Jonny Ruiz, reviewed med list.

## 2012-01-14 NOTE — Discharge Summary (Addendum)
Physician Discharge Summary  Patient ID: John [name] MRN: [id number] DOB/AGE: July 28, 1923 78 y.o.  Admit date: 01/14/2012 Discharge date: 01/14/2012  Admission Diagnoses:right trimalleolar ankle ORIF with loss of fixation and ankle subluxation, hardware fixation failure noted on 01/13/12 office visit and 01/04/12 surgery at [facility].  In addition he is finishing a course of ABX for pneumonia treatment that began at previous admission.  Discharge Diagnoses: same   Discharged Condition: fair  Hospital Course: underwent ORIF revision of right trimalleolar fracture and LLC application to prevent weight bearing. Did well post op, xrays show good position and allignment  Consults: None  Significant Diagnostic Studies: radiology: fluro spot ankle images after correction of ankle and refixation of tibia and fibula  Treatments: surgery: as above  Discharge Exam: Blood pressure 132/77, pulse 79, temperature 97.8 F (36.6 C), temperature source Oral, resp. rate 18, SpO2 100.00%.   Disposition: 03-Skilled Nursing Facility  Discharge Orders    Future Appointments: Provider: Department: Dept Phone: Center:   04/05/2012 8:05 AM [phone] [facility]     Medication List  As of 01/14/2012  9:07 PM   STOP taking these medications         enoxaparin 40 MG/0.4ML injection         TAKE these medications         acetaminophen 325 MG tablet   Commonly known as: TYLENOL   Take 2 tablets (650 mg total) by mouth every 6 (six) hours as needed (or Fever >/= 101).      aspirin 81 MG EC tablet   Take 81 mg by mouth daily.      CALCIUM-VITAMIN D PO   Take by mouth daily. Calcium 1200, vit d 1000      carvedilol 25 MG tablet   Commonly known as: COREG   Take 25 mg by mouth 2 (two) times daily with a meal.      DSS 100 MG Caps   Take 100 mg by mouth 2 (two) times daily.      enalapril 10 MG tablet   Commonly known as: VASOTEC   Take 10 mg by  mouth daily.      ergocalciferol 50000 UNITS capsule   Commonly known as: VITAMIN D2   Take 50,000 Units by mouth once a week. On Sunday.      escitalopram 10 MG tablet   Commonly known as: LEXAPRO   Take 10 mg by mouth daily.      fish oil-omega-3 fatty acids 1000 MG capsule   Take 1 g by mouth daily.      furosemide 40 MG tablet   Commonly known as: LASIX   Take 40 mg by mouth daily.      HYDROcodone-acetaminophen 5-325 MG per tablet   Commonly known as: NORCO/VICODIN   Take 1 tablet by mouth every 6 (six) hours as needed.      magnesium oxide 400 MG tablet   Commonly known as: MAG-OX   Take 400 mg by mouth daily.      multivitamin tablet   Take 1 tablet by mouth daily.      NEURPATH-B 3-35-2 MG Tabs   Take 1 tablet by mouth at bedtime.      niacin 500 MG tablet   Commonly known as: SLO-NIACIN   Take 500 mg by mouth 2 (two) times daily with a meal.      potassium chloride SA 20 MEQ tablet   Commonly known as: K-DUR,KLOR-CON  Take 1 tablet (20 mEq total) by mouth 2 (two) times daily.             SignedEldred Manges 01/14/2012, 9:07 PM

## 2012-01-15 ENCOUNTER — Encounter (HOSPITAL_COMMUNITY): Payer: Self-pay | Admitting: Orthopaedic Surgery

## 2012-01-15 MED ORDER — FA-PYRIDOXINE-CYANOCOBALAMIN 2.5-25-2 MG PO TABS
1.0000 | ORAL_TABLET | Freq: Every day | ORAL | Status: DC
  Filled 2012-01-15: qty 1

## 2012-01-15 MED ORDER — CARVEDILOL 25 MG PO TABS
25.0000 mg | ORAL_TABLET | Freq: Two times a day (BID) | ORAL | Status: DC
  Administered 2012-01-15 (×2): 25 mg via ORAL
  Filled 2012-01-15 (×3): qty 1

## 2012-01-15 MED ORDER — ESCITALOPRAM OXALATE 10 MG PO TABS
10.0000 mg | ORAL_TABLET | Freq: Every day | ORAL | Status: DC
  Administered 2012-01-15: 10 mg via ORAL
  Filled 2012-01-15: qty 1

## 2012-01-15 MED ORDER — DOCUSATE SODIUM 100 MG PO CAPS
100.0000 mg | ORAL_CAPSULE | Freq: Two times a day (BID) | ORAL | Status: DC
  Administered 2012-01-15: 100 mg via ORAL

## 2012-01-15 MED ORDER — MAGNESIUM OXIDE 400 MG PO TABS: 400.0000 mg | ORAL_TABLET | Freq: Every day | ORAL | Status: DC

## 2012-01-15 MED ORDER — MAGNESIUM OXIDE 400 (241.3 MG) MG PO TABS
400.0000 mg | ORAL_TABLET | Freq: Every day | ORAL | Status: DC
  Administered 2012-01-15: 400 mg via ORAL
  Filled 2012-01-15: qty 1

## 2012-01-15 MED ORDER — ENALAPRIL MALEATE 10 MG PO TABS
10.0000 mg | ORAL_TABLET | Freq: Every day | ORAL | Status: DC
  Administered 2012-01-15: 10 mg via ORAL
  Filled 2012-01-15: qty 1

## 2012-01-15 MED ORDER — OMEGA-3-ACID ETHYL ESTERS 1 G PO CAPS
1.0000 g | ORAL_CAPSULE | Freq: Every day | ORAL | Status: DC
  Administered 2012-01-15: 1 g via ORAL
  Filled 2012-01-15: qty 1

## 2012-01-15 MED ORDER — ACETAMINOPHEN 325 MG PO TABS: 650.0000 mg | ORAL_TABLET | Freq: Four times a day (QID) | ORAL | Status: DC | PRN

## 2012-01-15 MED ORDER — ONE-DAILY MULTI VITAMINS PO TABS
1.0000 | ORAL_TABLET | Freq: Every day | ORAL | Status: DC
Start: 2012-01-15 — End: 2012-01-15

## 2012-01-15 MED ORDER — ASPIRIN 81 MG PO TBEC: 81.0000 mg | DELAYED_RELEASE_TABLET | Freq: Every day | ORAL | Status: DC

## 2012-01-15 MED ORDER — FUROSEMIDE 40 MG PO TABS
40.0000 mg | ORAL_TABLET | Freq: Every day | ORAL | Status: DC
  Administered 2012-01-15: 40 mg via ORAL
  Filled 2012-01-15: qty 1

## 2012-01-15 MED ORDER — ERGOCALCIFEROL 1.25 MG (50000 UT) PO CAPS: 50000.0000 [IU] | ORAL_CAPSULE | ORAL | Status: DC

## 2012-01-15 MED ORDER — CALCIUM CARBONATE-VITAMIN D 500-200 MG-UNIT PO TABS
1.0000 | ORAL_TABLET | Freq: Every day | ORAL | Status: DC
  Administered 2012-01-15: 1 via ORAL
  Filled 2012-01-15: qty 1

## 2012-01-15 MED ORDER — CALCIUM-VITAMIN D 500-200 MG-UNIT PO TABS: 1.0000 | ORAL_TABLET | Freq: Every day | ORAL | Status: DC

## 2012-01-15 MED ORDER — ASPIRIN EC 81 MG PO TBEC
81.0000 mg | DELAYED_RELEASE_TABLET | Freq: Every day | ORAL | Status: DC
Start: 2012-01-15 — End: 2012-01-15
  Administered 2012-01-15: 81 mg via ORAL
  Filled 2012-01-15: qty 1

## 2012-01-15 MED ORDER — VITAMIN D (ERGOCALCIFEROL) 1.25 MG (50000 UNIT) PO CAPS: 50000.0000 [IU] | ORAL_CAPSULE | ORAL | Status: DC

## 2012-01-15 MED ORDER — NIACIN ER 500 MG PO CPCR
500.0000 mg | ORAL_CAPSULE | Freq: Two times a day (BID) | ORAL | Status: DC
  Administered 2012-01-15: 500 mg via ORAL
  Filled 2012-01-15 (×4): qty 1

## 2012-01-15 MED ORDER — OMEGA-3 FATTY ACIDS 1000 MG PO CAPS: 1.0000 g | ORAL_CAPSULE | Freq: Every day | ORAL | Status: DC

## 2012-01-15 MED ORDER — NEURPATH-B 3-35-2 MG PO TABS: 1.0000 | ORAL_TABLET | Freq: Every day | ORAL | Status: DC

## 2012-01-15 MED ORDER — ADULT MULTIVITAMIN W/MINERALS CH
1.0000 | ORAL_TABLET | Freq: Every day | ORAL | Status: DC
  Administered 2012-01-15: 1 via ORAL
  Filled 2012-01-15: qty 1

## 2012-01-15 NOTE — Progress Notes (Signed)
PT Note: PT orders received and noted pt being transferred back to SNF today.  Will d/c PT order at this time.  Please re-order if pt's status changes. Clydie Braun L. Katrinka Blazing, PT

## 2012-01-15 NOTE — Discharge Summary (Signed)
Physician Discharge Summary  Patient ID: John [name] MRN: [id number] DOB/AGE: 27-May-1923 78 y.o.  Admit date: 01/14/2012 Discharge date: 01/15/2012  Admission Diagnoses:  Ankle fracture, right with loss of reduction and fixation requiring revision of hardware and long leg casting.  Discharge Diagnoses:  Principal Problem:  *Ankle fracture, right same as above.  Past Medical History  Diagnosis Date  . Permanent atrial fibrillation     NOT CANDIDATE FOR COUMADIN  . Bradycardia     AV JUNCTION ABLATION W/RESUMPTION ON CONDUCTION S/P StJUDE PACEMAKER-2011 REVISION/St.JUDE ACCENT SR  . Hypertension   . Subdural hematoma 2003    bilateral, WHILE ON COUMADIN  . Colitis   . Cardiomyopathy     EF had been 35% (nonischemic, cath 09).  Last echo 2010 EF 55%  . Mitral regurgitation     Moderate  . Arthritis   . Depression   . Congestive heart failure   . Atrial fibrillation   . Vitamin d deficiency   . Pneumonia 01/13/12    being treated for pneumonia  . Pacemaker     Surgeries: Procedure(s): OPEN REDUCTION INTERNAL FIXATION (ORIF) ANKLE FRACTURE on 01/14/2012   Consultants (if any):  none  Discharged Condition: Improved  Hospital Course: DIONTAY ROSENCRANS is an 78 y.o. male who was admitted 01/14/2012 with a diagnosis of Ankle fracture, right which was status post ORIF app 3 weeks ago.  Pt noted to have loss of reduction and fixation  and went to the operating room on 01/14/2012 and underwent the above named procedures. Hardware was revised and pt was placed in a long leg cast.  He was instructed in cast care and strict non weight bearing of the right leg.  With the cast and weight bearing restrictions pt was not ambulatory and will continue with bed to chair and wheelchair transfers only.  His bed was held at [facility] and he will continue care there.    He was given perioperative antibiotics:  Anti-infectives     Start     Dose/Rate Route Frequency Ordered  Stop   01/14/12 1715   ceFAZolin (ANCEF) IVPB 2 g/50 mL premix        2 g 100 mL/hr over 30 Minutes Intravenous Every 6 hours 01/14/12 1712 01/15/12 1114   01/13/12 2228   ceFAZolin (ANCEF) IVPB 2 g/50 mL premix        2 g 100 mL/hr over 30 Minutes Intravenous 60 min pre-op 01/13/12 2228 01/14/12 1255        .  He was given sequential compression devices and aspirin for DVT prophylaxis.  He benefited maximally from the hospital stay and there were no complications.    Recent vital signs:  Filed Vitals:   01/15/12 0646  BP: 140/80  Pulse: 82  Temp: 98.7 F (37.1 C)  Resp: 18    Recent laboratory studies:  Lab Results  Component Value Date   HGB 11.0* 01/14/2012   HGB 10.0* 01/06/2012   HGB 9.9* 01/05/2012   Lab Results  Component Value Date   WBC 7.0 01/14/2012   PLT 301 01/14/2012   Lab Results  Component Value Date   INR 1.37 01/14/2012   Lab Results  Component Value Date   NA 133* 01/06/2012   K 3.5 01/06/2012   CL 97 01/06/2012   CO2 27 01/06/2012   BUN 10 01/06/2012   CREATININE 0.65 01/06/2012   GLUCOSE 91 01/06/2012    Discharge Medications:   Medication List  As  of 01/15/2012  8:59 AM   STOP taking these medications         enoxaparin 40 MG/0.4ML injection         TAKE these medications         acetaminophen 325 MG tablet   Commonly known as: TYLENOL   Take 2 tablets (650 mg total) by mouth every 6 (six) hours as needed (or Fever >/= 101).      aspirin 81 MG EC tablet   Take 81 mg by mouth daily.      CALCIUM-VITAMIN D PO   Take by mouth daily. Calcium 1200, vit d 1000      carvedilol 25 MG tablet   Commonly known as: COREG   Take 25 mg by mouth 2 (two) times daily with a meal.      DSS 100 MG Caps   Take 100 mg by mouth 2 (two) times daily.      enalapril 10 MG tablet   Commonly known as: VASOTEC   Take 10 mg by mouth daily.      ergocalciferol 50000 UNITS capsule   Commonly known as: VITAMIN D2   Take 50,000 Units by mouth once a  week. On Sunday.      escitalopram 10 MG tablet   Commonly known as: LEXAPRO   Take 10 mg by mouth daily.      fish oil-omega-3 fatty acids 1000 MG capsule   Take 1 g by mouth daily.      furosemide 40 MG tablet   Commonly known as: LASIX   Take 40 mg by mouth daily.      HYDROcodone-acetaminophen 5-325 MG per tablet   Commonly known as: NORCO/VICODIN   Take 1 tablet by mouth every 6 (six) hours as needed.      magnesium oxide 400 MG tablet   Commonly known as: MAG-OX   Take 400 mg by mouth daily.      methocarbamol 500 MG tablet   Commonly known as: ROBAXIN   Take 1 tablet (500 mg total) by mouth every 6 (six) hours as needed.      multivitamin tablet   Take 1 tablet by mouth daily.      NEURPATH-B 3-35-2 MG Tabs   Take 1 tablet by mouth at bedtime.      niacin 500 MG tablet   Commonly known as: SLO-NIACIN   Take 500 mg by mouth 2 (two) times daily with a meal.      oxyCODONE-acetaminophen 5-325 MG per tablet   Commonly known as: PERCOCET/ROXICET   Take 1-2 tablets by mouth every 4 (four) hours as needed.      potassium chloride SA 20 MEQ tablet   Commonly known as: K-DUR,KLOR-CON   Take 1 tablet (20 mEq total) by mouth 2 (two) times daily.            Diagnostic Studies: Dg Chest 2 View  01/03/2012  *RADIOLOGY REPORT*  Clinical Data: Cough and shortness of breath.  CHEST - 2 VIEW  Comparison: Chest x-ray 08/05/2011.  Findings: Lung volumes are low.  Right lower lobe airspace consolidation concerning for pneumonia.  Small bilateral pleural effusions.  Pulmonary venous congestion without frank pulmonary edema.  Mild enlargement of the cardiopericardial silhouette. Tortuosity and atherosclerosis of the thoracic aorta.  A left-sided pacemaker device in place with lead tips projecting over the expected location of the right atrium and right ventricular apex.  IMPRESSION: 1.  Findings concerning for right lower lobe pneumonia. 2.  Small bilateral pleural effusions. 3.   Mild cardiomegaly with pulmonary venous congestion, but no frank pulmonary edema.  4.  Atherosclerosis.  Original Report Authenticated By: Florencia Reasons, M.D.   Dg Ankle 2 Views Right  01/04/2012  *RADIOLOGY REPORT*  Clinical Data: ORIF distal fibular and medial malleolar fractures of the right ankle.  RIGHT ANKLE - 2 VIEW  Comparison: Right ankle x-rays yesterday.  Findings: 3 spot images from the C-arm fluoroscopic device, AP and lateral views of the right ankle, were submitted for interpretation post-operatively.  Plate screw fixation of the comminuted distal fibular fracture.  Compression screw fixation of the medial malleolar fracture.  Anatomic alignment.  Intact ankle mortise.  IMPRESSION: Anatomic alignment post ORIF.  Original Report Authenticated By: Arnell Sieving, M.D.   Dg Ankle Complete Right  01/14/2012  *RADIOLOGY REPORT*  Clinical Data: Ankle fracture.  Evaluate for malalignment.  RIGHT ANKLE - COMPLETE 3+ VIEW  Comparison: Earlier today at 1149 hours.  Findings: 4 intraoperative images.  Lateral plate and screw fixation of the fibula again identified.  There has been revision of the tibial fixation.  Improved alignment of the tibiotalar articulation and the posterior tibial fracture.  IMPRESSION: Interval revision of tibial fixation, with improved alignment.  Similar appearance of fibular plate and screw fixation device.   Original Report Authenticated By: Consuello Bossier, M.D.    Dg Ankle Complete Right  01/03/2012  *RADIOLOGY REPORT*  Clinical Data: History of fall complaining of ankle pain.  RIGHT ANKLE - COMPLETE 3+ VIEW  Comparison: No priors.  Findings: There is a trimalleolar fracture of the right ankle. There is medial angulation of both the distal fibular fracture and the fracture through the medial malleolus.  Mild posterior displacement of the fracture of the posterior aspect of the distal tibia is noted.  Extensive overlying soft tissue swelling. Visualized portions of  the hindfoot and midfoot appear intact.  IMPRESSION: 1.  Trimalleolar fracture of the right ankle, as detailed above.  Original Report Authenticated By: Florencia Reasons, M.D.   Dg Ankle Right Port  01/14/2012  *RADIOLOGY REPORT*  Clinical Data: Bimalleolar fracture, post ORIF  PORTABLE RIGHT ANKLE - 2 VIEW  Comparison: 01/04/2012  Findings:  Nonstandard views. Changes of plate screw fixation of the distal right fibula and compression screw fixation of the medial malleolar fracture.  There may have been posterior subluxation of the talus with respect to the distal tibial articular surface, probably further displacement of the posterior malleolar fracture   fragment since initial radiographs.  Cast material obscures fine bone detail.  IMPRESSION:  1.  Suspect posterior subluxation of the talus from the distal tibial articular surface, with suspicion of further displacement of the posterior malleolar fracture fragment.   Original Report Authenticated By: Osa Craver, M.D.    Dg C-arm 1-60 Min-no Report  01/04/2012  CLINICAL DATA: fracture right ankle   C-ARM 1-60 MINUTES  Fluoroscopy was utilized by the requesting physician.  No radiographic  interpretation.      Disposition: 03-Skilled Nursing Facility  Discharge Orders    Future Appointments: Provider: Department: Dept Phone: Center:   04/05/2012 8:05 AM [facility] [phone] [facility]     Future Orders Please Complete By Expires   Diet - low sodium heart healthy      Call MD / Call 911      Comments:   If you experience chest pain or shortness of breath, CALL 911 and be transported to  the hospital emergency room.  If you develope a fever above 101 F, pus (white drainage) or increased drainage or redness at the wound, or calf pain, call your surgeon's office.   Constipation Prevention      Comments:   Drink plenty of fluids.  Prune juice may be helpful.  You may use a stool softener, such  as Colace (over the counter) 100 mg twice a day.  Use MiraLax (over the counter) for constipation as needed.   Discharge instructions      Comments:   Keep cast dry and clean at all times.  Strict non weight bearing to right lower extremity.  Bed to chair and wheelchair transfers only with physical therapy. Elevate foot on 3 pillows to place at or above heart level.  Move toes frequently.  Office visit in one week with DR Ophelia Charter      Follow-up Information    Follow up with YATES,MARK C, MD in 1 week.   Contact information:   [facility] [address] [place] [address] [phone]           Signed: Wende Neighbors 01/15/2012, 8:59 AM

## 2012-01-15 NOTE — Progress Notes (Signed)
Utilization review completed. Jimmye Wisnieski, RN, BSN. 

## 2012-01-15 NOTE — Progress Notes (Signed)
Subjective: 1 Day Post-Op Procedure(s) (LRB): OPEN REDUCTION INTERNAL FIXATION (ORIF) ANKLE FRACTURE (Right) Patient reports pain as mild.   Toes more swollen today per pt and wife which is expected.   Bed was held at [facility] and pt will return there today Cast rubbing top of toes and will need to be trimmed by ortho tech before discharge. Objective: Vital signs in last 24 hours: Temp:  [96.8 F (36 C)-98.7 F (37.1 C)] 98.7 F (37.1 C) (08/22 0646) Pulse Rate:  [79-100] 82  (08/22 0646) Resp:  [18-19] 18  (08/22 0646) BP: (127-144)/(77-95) 140/80 mmHg (08/22 0646) SpO2:  [94 %-100 %] 94 % (08/22 0646)  Intake/Output from previous day: 08/21 0701 - 08/22 0700 In: 600 [I.V.:600] Out: 525 [Urine:500; Blood:25] Intake/Output this shift:     Basename 01/14/12 1118  HGB 11.0*    Basename 01/14/12 1118  WBC 7.0  RBC 3.75*  HCT 33.3*  PLT 301   No results found for this basename: NA:2,K:2,CL:2,CO2:2,BUN:2,CREATININE:2,GLUCOSE:2,CALCIUM:2 in the last 72 hours  Basename 01/14/12 1118  LABPT --  INR 1.37    Neurovascular intact cap refill intact.  mild edema of toes.  cast causing indentation of skin of dorsal toes, but no breakdown  Assessment/Plan: 1 Day Post-Op Procedure(s) (LRB): OPEN REDUCTION INTERNAL FIXATION (ORIF) ANKLE FRACTURE (Right) Discharge to SNF: return to [facility] today OV 1 week with Dr Ophelia Charter. Strict non weight bearing to right leg, therefore do not expect he will be ambulatory but bed to chair transfers only Strict elevation at rest with foot at or above heart level.  Demonstrated to pts wife. COD stable  Wake Conlee M 01/15/2012, 8:39 AM

## 2012-01-15 NOTE — Clinical Social Work Psychosocial (Signed)
     Clinical Social Work Department BRIEF PSYCHOSOCIAL ASSESSMENT 01/15/2012  Patient:  John [name], John [name]     Account Number:  [id number]     Admit date:  01/14/2012  Clinical Social Worker:  Burnard Hawthorne  Date/Time:  01/15/2012 03:07 PM  Referred by:  Physician  Date Referred:  01/15/2012 Referred for  SNF Placement   Other Referral:   Interview type:  Other - See comment Other interview type:   Patient and his wife    PSYCHOSOCIAL DATA Living Status:  FACILITY Admitted from facility:  [facility] Level of care:  Skilled Nursing Facility Primary support name:  Darrelle Wiberg Primary support relationship to patient:  SPOUSE Degree of support available:   Very invovled and supportive    CURRENT CONCERNS Current Concerns  Post-Acute Placement   Other Concerns:   (Return to SNF)    SOCIAL WORK ASSESSMENT / PLAN Resident of [facility]. Overnight stay in hospital for surgery. Met with patient and wife- bed is being held at [facility]. OK per MD for d/c back to SNF today via EMS.  Ok per Ulen at [facility].  EMS auth obtained per Carollee Herter at [facility].  Notified patient's nurse of d/c plan.   Assessment/plan status:  No Further Intervention Required Other assessment/ plan:   Information/referral to community resources:   None   DC back to SNF today    PATIENTS/FAMILYS RESPONSE TO PLAN OF CARE: Patient and his wife are very pleased with d/c and return to SNF. Notified SNF and patient's nurse of DC plan

## 2012-01-15 NOTE — Op Note (Signed)
NAME:  John [name], John [name] NO.:  [id number]  MEDICAL RECORD NO.:  [id number]  LOCATION:  5N17C                        FACILITY:  [facility]  PHYSICIAN:  Jaeleah Smyser C. Ophelia Charter, M.D.    DATE OF BIRTH:  Jul 07, 1923  DATE OF PROCEDURE:  01/14/2012 DATE OF DISCHARGE:                              OPERATIVE REPORT   PREOPERATIVE DIAGNOSES:  Right trimalleolar ankle fracture, status post open reduction and internal fixation with loss of fixation, malalignment and ankle joint subluxation.  POSTOPERATIVE DIAGNOSES:  Right trimalleolar ankle fracture, status post open reduction and internal fixation with loss of fixation, malalignment and ankle joint subluxation.  PROCEDURE:  Refixation of right trimalleolar ankle fracture.  SURGEON:  Missy Baksh C. Ophelia Charter, M.D.  ANESTHESIA:  Spinal.  EBL:  Minimal.  TOURNIQUET TIME:  47 minutes.  DRAINS:  None.  INDICATIONS FOR PROCEDURE:  This 78 year old male who is in poor health, multiple problems including pacemaker, ejection fraction of 35%.  He was a minimal household ambulator and needed to someone to help to pull him up to get him up in a standing position with the walker so he could ambulate a short distance.  Due to poor cardiac output, this was a minimal ambulation distance.  He suffered a trimalleolar ankle fracture, had ORIF of medial and lateral malleolus at [facility], done on January 04, 2012, was in the nursing home and apparently may have put more weight on it than touchdown only and suffered loss fixation in the medial malleolus, bending of the medial malleolar screws and subluxation of the ankle with the talus anteriorly out of the front of the ankle joint, displacement of the posterior malleolar fragment.  The patient has had chronic venous stasis ulcers and has been through three different courses where he had Unna boots applied for clearing of leg ulcerations.  Fortunately, at this point, he does not have any open ulcers.  The  patient was brought in at this time and by the time he got to the hospital after removal of his cast, there had been pullout of the screws of the distal fibula, bottom three screws as well, which was not present yesterday.  PROCEDURE:  After induction of spinal anesthesia, proximal thigh tourniquet, standard DuraPrep, stockinette, extremity sheets and drapes, time-out procedure was completed.  The patient had been getting antibiotics, had 2 days left on his antibiotic course for pneumonia and also was given 2 g of Ancef prophylactically.  Staple was removed.  Old incision was opened up, subcutaneous Vicryl cut.  Lateral side was exposed first and fibular hardware was removed since the bottom three screws were out.  Yesterday, the x-ray showed good position and alignment with the fibula still straight and all screws in place.  Once this was removed, a longer plate 7-hole was selected and locking screws were replaced, distal screw had to be torque slightly since about when the plate was bent and the locking screws had to be sort of cross threaded in order to lock since nonlocking cancellous screw would not hold in the extremely soft bone.  The anterior-posterior screw, which was 3.5 cortical screw was left in since it held the fibula straight and the fibula  was still in good position.  Spot picture was taken and then the final screw was added at the proximal aspect of the 7-hole plate, which was a 16-mm locking Synthes.  Attention was turned to the medial side screws were backed out.  It was apparent that one of the screws were bent.  Fracture was cleaned out. Fracture was extended around posteromedially and there was a large posterior malleolar fragment.  Once fracture hematoma was evacuated using a tenaculum clamp, the fracture was reduced with some difficulty, held with 2 K-wires out of the Synthes set, checked under fluoroscopy showing good reduction of the mortise anatomic position  and correction of the anterior subluxation, which was nearly a dislocation with the talus anterior to the tibia.  AP and lateral showed good position and mortise showed good reduction and then longer 50-mm lag screws were placed replacing the bent screws.  Additional screws were placed from medial to lateral coming anterior to the fibula so that the screws were not in the syndesmosis.  Stab incision was made proximal and lateral and blunt dissection down to the bone and drilling with the gold drill bit and a 15-mm lag screw was placed from proximal extending down distal posteriorly and lagging the posterior malleolar fragment for bicortical fixation.  Initially, this was checked under fluoroscopy, was close to the joint and was backed up and re-angled and had good bicortical fixation.  A total of five screws were placed for the medial side, two of the malleolus in standard position, two transverse across and then third from anterior proximal to distal posterior, which locked down the fracture securely.  Repeat irrigation and then reapproximation with 2-0 nylon sutures.  Xeroform and 4x4s were placed followed by long leg fiberglass cast with the knee bent to prevent the patient from walking on his foot.  The patient tolerated the procedure well and was transferred to the recovery room in stable condition.  Instrument count and needle count was correct.  A single suture was placed in the anterior stab incision for the posterior malleolar lag screw, and both the medial and lateral incisions were closed with nylon suture.  Final spot pictures were taken, which showed good position and alignment in AP, lateral and mortise.     Mychael Soots C. Ophelia Charter, M.D.     MCY/MEDQ  D:  01/14/2012  T:  01/15/2012  Job:  [id number]

## 2012-01-21 ENCOUNTER — Encounter: Payer: Self-pay | Admitting: *Deleted

## 2012-04-05 ENCOUNTER — Encounter: Payer: Medicare Other | Admitting: *Deleted

## 2012-04-06 ENCOUNTER — Encounter: Payer: Self-pay | Admitting: *Deleted

## 2012-04-14 ENCOUNTER — Ambulatory Visit (INDEPENDENT_AMBULATORY_CARE_PROVIDER_SITE_OTHER): Payer: Medicare Other | Admitting: *Deleted

## 2012-04-14 DIAGNOSIS — I4891 Unspecified atrial fibrillation: Secondary | ICD-10-CM

## 2012-04-14 DIAGNOSIS — I442 Atrioventricular block, complete: Secondary | ICD-10-CM

## 2012-04-14 LAB — PACEMAKER DEVICE OBSERVATION
BMOD-0002RV: 12
BRDY-0002RV: 70 {beats}/min
BRDY-0004RV: 120 {beats}/min
BRDY-0005RV: 60 {beats}/min
DEVICE MODEL PM: 2309283
RV LEAD AMPLITUDE: 10.2 mv

## 2012-05-07 ENCOUNTER — Encounter: Payer: Self-pay | Admitting: Internal Medicine

## 2012-10-07 ENCOUNTER — Ambulatory Visit (INDEPENDENT_AMBULATORY_CARE_PROVIDER_SITE_OTHER): Payer: Medicare Other | Admitting: Internal Medicine

## 2012-10-07 ENCOUNTER — Encounter: Payer: Self-pay | Admitting: Internal Medicine

## 2012-10-07 VITALS — BP 100/60 | Wt 168.0 lb

## 2012-10-07 DIAGNOSIS — I4891 Unspecified atrial fibrillation: Secondary | ICD-10-CM

## 2012-10-07 DIAGNOSIS — Z950 Presence of cardiac pacemaker: Secondary | ICD-10-CM

## 2012-10-07 DIAGNOSIS — I442 Atrioventricular block, complete: Secondary | ICD-10-CM

## 2012-10-07 LAB — PACEMAKER DEVICE OBSERVATION
BATTERY VOLTAGE: 2.9478 V
BMOD-0002RV: 12
BRDY-0005RV: 60 {beats}/min
DEVICE MODEL PM: 2309283
RV LEAD IMPEDENCE PM: 525 Ohm
RV LEAD THRESHOLD: 1.25 V
VENTRICULAR PACING PM: 37

## 2012-10-07 NOTE — Assessment & Plan Note (Signed)
The patient's device was interrogated.  The information was reviewed. No changes were made in the programming.    

## 2012-10-07 NOTE — Assessment & Plan Note (Signed)
Stable post pacing 

## 2012-10-07 NOTE — Assessment & Plan Note (Signed)
Permanent; on asa  Will consider the use of anticoagulation now that the fall risk is gone

## 2012-10-07 NOTE — Patient Instructions (Signed)
Your physician wants you to follow-up in: 6 months with the device clinic & 1 year with Dr. Klein. You will receive a reminder letter in the mail two months in advance. If you don't receive a letter, please call our office to schedule the follow-up appointment.  Your physician recommends that you continue on your current medications as directed. Please refer to the Current Medication list given to you today.    

## 2012-10-07 NOTE — Progress Notes (Signed)
Patient Care Team: Minda Meo, MD as PCP - General (Internal Medicine)   HPI  John [name] is a 79 y.o. male Seen in followup for atrial fibrillation which is permanent status post AV junction ablation with resumption of antegrade conduction. He status post pacemaker implantation. There has been significant interval improvement in his nonischemic myopathy with EF going from 30%->>55%.  Has history of subdural hematoma; hence he does not take Coumadin  Last cath 01/2008 demonstrated luminal irregs. Echo in 04/2009: EF 50-55%, mod LVH, mild AI, mild MVP of post leaflet with mod MR, severe BAE and PASP 45.  Interval history has been notable for a number of falls.  He recently fractured his ankle and was failure to reset. He is his nonambulatory i and is at [facility] Past Medical History  Diagnosis Date  . Permanent atrial fibrillation     NOT CANDIDATE FOR COUMADIN  . Bradycardia     AV JUNCTION ABLATION W/RESUMPTION ON CONDUCTION S/P StJUDE PACEMAKER-2011 REVISION/St.JUDE ACCENT SR  . Hypertension   . Subdural hematoma 2003    bilateral, WHILE ON COUMADIN  . Colitis   . Cardiomyopathy     EF had been 35% (nonischemic, cath 09).  Last echo 2010 EF 55%  . Mitral regurgitation     Moderate  . Arthritis   . Depression   . Congestive heart failure   . Atrial fibrillation   . Vitamin D deficiency   . Pneumonia 01/13/12    being treated for pneumonia  . Pacemaker     Past Surgical History  Procedure Laterality Date  . Subdural hematoma evacuation via craniotomy    . Cystectomy      BREAST, NECK  . Hand surgery    . Insert / replace / remove pacemaker      PACEMAKER IMPLANT 2003-2010.Marland KitchenMarland KitchenREVISION St, JUDE ACCENT SR  . Tonsillectomy and adenoidectomy    . Orif ankle fracture  01/04/2012    Procedure: OPEN REDUCTION INTERNAL FIXATION (ORIF) ANKLE FRACTURE;  Surgeon: Eldred Manges, MD;  Location: [facility];  Service: Orthopedics;  Laterality: Right;  bilateral fixation trimalleolar  fixation  . Orif ankle fracture  01/14/2012    Procedure: OPEN REDUCTION INTERNAL FIXATION (ORIF) ANKLE FRACTURE;  Surgeon: Eldred Manges, MD;  Location: [facility];  Service: Orthopedics;  Laterality: Right;  Right Ankle Fracture Revision    Current Outpatient Prescriptions  Medication Sig Dispense Refill  . acetaminophen (TYLENOL) 325 MG tablet Take 2 tablets (650 mg total) by mouth every 6 (six) hours as needed (or Fever >/= 101).      Marland Kitchen aspirin 81 MG EC tablet Take 81 mg by mouth daily.        Marland Kitchen CALCIUM-VITAMIN D PO Take by mouth daily. Calcium 1200, vit d 1000       . carvedilol (COREG) 25 MG tablet Take 25 mg by mouth 2 (two) times daily with a meal.       . diclofenac sodium (VOLTAREN) 1 % GEL Apply 4 g topically as needed.      . docusate sodium (COLACE) 100 MG capsule Take 100 mg by mouth 2 (two) times daily.      . enalapril (VASOTEC) 10 MG tablet Take 10 mg by mouth daily.        . ergocalciferol (VITAMIN D2) 50000 UNITS capsule Take 50,000 Units by mouth once a week. On Sunday.      . escitalopram (LEXAPRO) 10 MG tablet Take 10 mg by mouth daily.       Marland Kitchen  fish oil-omega-3 fatty acids 1000 MG capsule Take 1 g by mouth daily.       . furosemide (LASIX) 40 MG tablet Take 40 mg by mouth daily.      Marland Kitchen HYDROcodone-acetaminophen (NORCO/VICODIN) 5-325 MG per tablet Take 1 tablet by mouth every 6 (six) hours as needed for pain.      Marland Kitchen L-Methylfolate-B6-B12 (NEURPATH-B) 3-35-2 MG TABS Take 1 tablet by mouth at bedtime.      . magnesium oxide (MAG-OX) 400 MG tablet Take 400 mg by mouth daily.      . methocarbamol (ROBAXIN) 500 MG tablet Take 500 mg by mouth every 6 (six) hours.      . mirtazapine (REMERON) 15 MG tablet Take 15 mg by mouth at bedtime.      . Multiple Vitamin (MULTIVITAMIN) tablet Take 1 tablet by mouth daily.        . niacin (SLO-NIACIN) 500 MG tablet Take 500 mg by mouth 2 (two) times daily with a meal.      . oxyCODONE-acetaminophen (PERCOCET/ROXICET) 5-325 MG per tablet Take 1  tablet by mouth every 4 (four) hours as needed.      . potassium chloride SA (K-DUR,KLOR-CON) 20 MEQ tablet Take 1 tablet (20 mEq total) by mouth 2 (two) times daily.  60 tablet  1   No current facility-administered medications for this visit.    Allergies  Allergen Reactions  . Celecoxib Other (See Comments)    Stomach bleed  . Warfarin Sodium     Causes heart rate, pace maker  . Zaroxolyn (Metolazone)     seizure    Review of Systems negative except from HPI and PMH  Physical Exam BP 100/60  Wt 168 lb (76.204 kg)  BMI 25.55 kg/m2 Well developed and nourished in no acute distress HENT normal Neck supple with JVP-flat Clear Regular rate and rhythm,  3/6 holosytolic Abd-soft with active BS No Clubbing cyanosis edema Skin-warm and dry A & Oriented   Stiff in bed    Assessment and  Plan

## 2013-03-09 ENCOUNTER — Encounter: Payer: Self-pay | Admitting: Internal Medicine

## 2013-04-11 ENCOUNTER — Encounter: Payer: Self-pay | Admitting: Internal Medicine

## 2013-04-11 ENCOUNTER — Ambulatory Visit (INDEPENDENT_AMBULATORY_CARE_PROVIDER_SITE_OTHER): Payer: Medicare Other | Admitting: *Deleted

## 2013-04-11 DIAGNOSIS — I442 Atrioventricular block, complete: Secondary | ICD-10-CM

## 2013-04-11 DIAGNOSIS — Z950 Presence of cardiac pacemaker: Secondary | ICD-10-CM

## 2013-04-11 LAB — MDC_IDC_ENUM_SESS_TYPE_INCLINIC
Battery Voltage: 2.95 V
Brady Statistic RV Percent Paced: 26 %
Lead Channel Impedance Value: 487.5 Ohm
Lead Channel Pacing Threshold Amplitude: 1 V
Lead Channel Sensing Intrinsic Amplitude: 10.9 mV
Lead Channel Setting Pacing Pulse Width: 0.4 ms
Lead Channel Setting Sensing Sensitivity: 2 mV

## 2013-04-11 NOTE — Progress Notes (Signed)
Device check in clinic, all functions normal, no changes made, full details in PaceArt.  ROV w/ [name] 09/2013.

## 2013-08-06 ENCOUNTER — Emergency Department (HOSPITAL_COMMUNITY): Payer: Medicare Other

## 2013-08-06 ENCOUNTER — Emergency Department (HOSPITAL_COMMUNITY)
Admission: EM | Admit: 2013-08-06 | Discharge: 2013-08-06 | Disposition: A | Discharge status: Home / Self Care | Payer: Medicare Other | Attending: Emergency Medicine | Admitting: Emergency Medicine

## 2013-08-06 ENCOUNTER — Encounter (HOSPITAL_COMMUNITY): Payer: Self-pay | Admitting: Emergency Medicine

## 2013-08-06 DIAGNOSIS — Z87891 Personal history of nicotine dependence: Secondary | ICD-10-CM | POA: Insufficient documentation

## 2013-08-06 DIAGNOSIS — E559 Vitamin D deficiency, unspecified: Secondary | ICD-10-CM | POA: Insufficient documentation

## 2013-08-06 DIAGNOSIS — I4891 Unspecified atrial fibrillation: Secondary | ICD-10-CM | POA: Insufficient documentation

## 2013-08-06 DIAGNOSIS — F3289 Other specified depressive episodes: Secondary | ICD-10-CM | POA: Insufficient documentation

## 2013-08-06 DIAGNOSIS — F329 Major depressive disorder, single episode, unspecified: Secondary | ICD-10-CM | POA: Insufficient documentation

## 2013-08-06 DIAGNOSIS — Z8719 Personal history of other diseases of the digestive system: Secondary | ICD-10-CM | POA: Insufficient documentation

## 2013-08-06 DIAGNOSIS — Z7982 Long term (current) use of aspirin: Secondary | ICD-10-CM | POA: Insufficient documentation

## 2013-08-06 DIAGNOSIS — I10 Essential (primary) hypertension: Secondary | ICD-10-CM | POA: Insufficient documentation

## 2013-08-06 DIAGNOSIS — I509 Heart failure, unspecified: Secondary | ICD-10-CM | POA: Insufficient documentation

## 2013-08-06 DIAGNOSIS — M129 Arthropathy, unspecified: Secondary | ICD-10-CM | POA: Insufficient documentation

## 2013-08-06 DIAGNOSIS — L0390 Cellulitis, unspecified: Secondary | ICD-10-CM

## 2013-08-06 DIAGNOSIS — L03119 Cellulitis of unspecified part of limb: Principal | ICD-10-CM

## 2013-08-06 DIAGNOSIS — L02419 Cutaneous abscess of limb, unspecified: Secondary | ICD-10-CM | POA: Insufficient documentation

## 2013-08-06 DIAGNOSIS — Z950 Presence of cardiac pacemaker: Secondary | ICD-10-CM | POA: Insufficient documentation

## 2013-08-06 DIAGNOSIS — Z79899 Other long term (current) drug therapy: Secondary | ICD-10-CM | POA: Insufficient documentation

## 2013-08-06 DIAGNOSIS — Z8701 Personal history of pneumonia (recurrent): Secondary | ICD-10-CM | POA: Insufficient documentation

## 2013-08-06 LAB — BASIC METABOLIC PANEL
BUN: 19 mg/dL (ref 6–23)
CALCIUM: 8.9 mg/dL (ref 8.4–10.5)
CO2: 29 mEq/L (ref 19–32)
CREATININE: 0.63 mg/dL (ref 0.50–1.35)
Chloride: 101 mEq/L (ref 96–112)
GFR calc non Af Amer: 86 mL/min — ABNORMAL LOW (ref >90)
Glucose, Bld: 94 mg/dL (ref 70–99)
Potassium: 3.9 mEq/L (ref 3.7–5.3)
Sodium: 139 mEq/L (ref 137–147)

## 2013-08-06 LAB — CBC WITH DIFFERENTIAL/PLATELET
BASOS ABS: 0 10*3/uL (ref 0.0–0.1)
BASOS PCT: 1 % (ref 0–1)
EOS PCT: 4 % (ref 0–5)
Eosinophils Absolute: 0.2 10*3/uL (ref 0.0–0.7)
HEMATOCRIT: 36.9 % — AB (ref 39.0–52.0)
Hemoglobin: 12.3 g/dL — ABNORMAL LOW (ref 13.0–17.0)
Lymphocytes Relative: 25 % (ref 12–46)
Lymphs Abs: 1.5 10*3/uL (ref 0.7–4.0)
MCH: 29.4 pg (ref 26.0–34.0)
MCHC: 33.3 g/dL (ref 30.0–36.0)
MCV: 88.3 fL (ref 78.0–100.0)
MONO ABS: 0.5 10*3/uL (ref 0.1–1.0)
MONOS PCT: 8 % (ref 3–12)
Neutro Abs: 3.6 10*3/uL (ref 1.7–7.7)
Neutrophils Relative %: 63 % (ref 43–77)
Platelets: 172 10*3/uL (ref 150–400)
RBC: 4.18 MIL/uL — ABNORMAL LOW (ref 4.22–5.81)
RDW: 14.8 % (ref 11.5–15.5)
WBC: 5.8 10*3/uL (ref 4.0–10.5)

## 2013-08-06 MED ORDER — CLINDAMYCIN HCL 300 MG PO CAPS: 300.0000 mg | ORAL_CAPSULE | Freq: Four times a day (QID) | ORAL | Status: DC

## 2013-08-06 MED ORDER — CLINDAMYCIN PHOSPHATE 600 MG/50ML IV SOLN
600.0000 mg | Freq: Once | INTRAVENOUS | Status: AC
  Administered 2013-08-06: 600 mg via INTRAVENOUS
  Filled 2013-08-06: qty 50

## 2013-08-06 NOTE — ED Notes (Signed)
Pt in from [facility] with c/o "surgical pins protruding out" of ankle. Surgery 2013 after Fx. Mild pain, pt does not normally ambulate. Redness noted to R ankle.

## 2013-08-06 NOTE — ED Notes (Signed)
Pt's' right ankle is warm, pink and painful to touch.  Pt is rating pain 2/10 at this time.  Per pt pain increased to a 10/10 when touched.  Pt is A&O x3, wife at bedside.  Will continue to monitor.

## 2013-08-06 NOTE — ED Provider Notes (Signed)
CSN: [id number]     Arrival date & time 08/06/13  1730 History   First MD Initiated Contact with Patient 08/06/13 1736     Chief Complaint  Patient presents with  . Ankle Pain     (Consider location/radiation/quality/duration/timing/severity/associated sxs/prior Treatment) The history is provided by the patient and the spouse.  ASEEM SESSUMS is a 80 y.o. male hx of HTN, CHF, afib not on coumadin, here with R ankle pain. He had ankle fracture in the past and had screws placed in 2013 by [name]. Noticed that his screws have been coming out for the last year. Over the last week, he noticed increasing redness at his ankle. Denies fevers. Also having more pain in the ankle.    Past Medical History  Diagnosis Date  . Permanent atrial fibrillation     NOT CANDIDATE FOR COUMADIN  . Bradycardia     AV JUNCTION ABLATION W/RESUMPTION ON CONDUCTION S/P StJUDE PACEMAKER-2011 REVISION/St.JUDE ACCENT SR  . Hypertension   . Subdural hematoma 2003    bilateral, WHILE ON COUMADIN  . Colitis   . Cardiomyopathy     EF had been 35% (nonischemic, cath 09).  Last echo 2010 EF 55%  . Mitral regurgitation     Moderate  . Arthritis   . Depression   . Congestive heart failure   . Atrial fibrillation   . Vitamin D deficiency   . Pneumonia 01/13/12    being treated for pneumonia  . Pacemaker    Past Surgical History  Procedure Laterality Date  . Subdural hematoma evacuation via craniotomy    . Cystectomy      BREAST, NECK  . Hand surgery    . Insert / replace / remove pacemaker      PACEMAKER IMPLANT 2003-2010.Marland KitchenMarland KitchenREVISION St, JUDE ACCENT SR  . Tonsillectomy and adenoidectomy    . Orif ankle fracture  01/04/2012    Procedure: OPEN REDUCTION INTERNAL FIXATION (ORIF) ANKLE FRACTURE;  Surgeon: Eldred Manges, MD;  Location: [facility];  Service: Orthopedics;  Laterality: Right;  bilateral fixation trimalleolar fixation  . Orif ankle fracture  01/14/2012    Procedure: OPEN REDUCTION INTERNAL FIXATION  (ORIF) ANKLE FRACTURE;  Surgeon: Eldred Manges, MD;  Location: [facility];  Service: Orthopedics;  Laterality: Right;  Right Ankle Fracture Revision   Family History  Problem Relation Age of Onset  . Leukemia Father   . Heart disease Father     angina  . Stroke Mother    History  Substance Use Topics  . Smoking status: Former Smoker    Types: Cigarettes  . Smokeless tobacco: Not on file  . Alcohol Use: No    Review of Systems  Musculoskeletal:       R ankle pain   All other systems reviewed and are negative.      Allergies  Celecoxib; Warfarin sodium; and Zaroxolyn  Home Medications   Current Outpatient Rx  Name  Route  Sig  Dispense  Refill  . acetaminophen (TYLENOL) 325 MG tablet   Oral   Take 650 mg by mouth every 6 (six) hours as needed. 2 tablets by mouth every six hours as needed         . aspirin 81 MG EC tablet   Oral   Take 81 mg by mouth daily.           Marland Kitchen CALCIUM-VITAMIN D PO   Oral   Take by mouth daily. Calcium 1200, vit d 1000          .  carvedilol (COREG) 25 MG tablet   Oral   Take 25 mg by mouth 2 (two) times daily with a meal.          . diclofenac sodium (VOLTAREN) 1 % GEL   Topical   Apply 4 g topically as needed.         . docusate sodium (COLACE) 100 MG capsule   Oral   Take 100 mg by mouth 2 (two) times daily.         . enalapril (VASOTEC) 10 MG tablet   Oral   Take 10 mg by mouth daily.           Marland Kitchen escitalopram (LEXAPRO) 10 MG tablet   Oral   Take 10 mg by mouth daily.          . fish oil-omega-3 fatty acids 1000 MG capsule   Oral   Take 3 g by mouth daily.          . folic acid (FOLVITE) 400 MCG tablet   Oral   Take 800 mcg by mouth 3 (three) times daily. 2 tablets by mouth three times daily         . furosemide (LASIX) 40 MG tablet   Oral   Take 40 mg by mouth daily.         Marland Kitchen ipratropium-albuterol (DUONEB) 0.5-2.5 (3) MG/3ML SOLN   Nebulization   Take 3 mLs by nebulization every 6 (six) hours as  needed.         . mirtazapine (REMERON) 7.5 MG tablet   Oral   Take 7.5 mg by mouth at bedtime.         . Multiple Vitamin (MULTIVITAMIN) tablet   Oral   Take 1 tablet by mouth daily.           Marland Kitchen omeprazole (PRILOSEC OTC) 20 MG tablet   Oral   Take 20 mg by mouth daily.         Marland Kitchen oxyCODONE-acetaminophen (PERCOCET/ROXICET) 5-325 MG per tablet   Oral   Take 1 tablet by mouth every 4 (four) hours as needed.         Bertram Gala Glycol-Propyl Glycol (SYSTANE) 0.4-0.3 % SOLN   Both Eyes   Place 1 drop into both eyes 2 (two) times daily.         . polyethylene glycol (MIRALAX / GLYCOLAX) packet   Oral   Take 17 g by mouth daily.         . potassium chloride SA (K-DUR,KLOR-CON) 20 MEQ tablet   Oral   Take 20 mEq by mouth daily.         . promethazine (PHENERGAN) 25 MG suppository   Rectal   Place 25 mg rectally every 6 (six) hours as needed for nausea or vomiting.         . promethazine (PHENERGAN) 25 MG tablet   Oral   Take 25 mg by mouth every 6 (six) hours as needed for nausea or vomiting.         . pyridOXINE (VITAMIN B-6) 50 MG tablet   Oral   Take 50 mg by mouth daily.         . diphenhydrAMINE (BENADRYL) 25 mg capsule   Oral   Take 25 mg by mouth every 6 (six) hours as needed.          BP 152/66  Pulse 84  Temp(Src) 97.7 F (36.5 C) (Oral)  Resp 18  SpO2 94% Physical Exam  Nursing  note and vitals reviewed. Constitutional: He is oriented to person, place, and time. He appears well-developed.  HENT:  Head: Normocephalic.  Eyes: Pupils are equal, round, and reactive to light.  Neck: Normal range of motion.  Cardiovascular: Normal rate.   Pulmonary/Chest: Effort normal and breath sounds normal.  Abdominal: Soft.  Musculoskeletal:  R ankle with screws palpable in the medial and lateral maleolus. + diffuse redness around lateral maleolus with streaking up the calf. No calf tenderness. 2+ pulses. No tenderness in foot   Neurological: He  is alert and oriented to person, place, and time. No cranial nerve deficit.  Skin: Skin is warm.  Psychiatric: He has a normal mood and affect. His behavior is normal. Judgment and thought content normal.    ED Course  Procedures (including critical care time) Labs Review Labs Reviewed  CBC WITH DIFFERENTIAL - Abnormal; Notable for the following:    RBC 4.18 (*)    Hemoglobin 12.3 (*)    HCT 36.9 (*)    All other components within normal limits  BASIC METABOLIC PANEL - Abnormal; Notable for the following:    GFR calc non Af Amer 86 (*)    All other components within normal limits   Imaging Review Dg Ankle Complete Right  08/06/2013   CLINICAL DATA:  Swelling and pain in right ankle.  EXAM: RIGHT ANKLE - COMPLETE 3+ VIEW  COMPARISON:  Right ankle ORIF 01/14/2012 and right ankle radiographs 01/03/2012  FINDINGS: There is lateral cortical plate and screw fixation of the distal fibula, and multiple screws traverse the distal distal tibia and distal fibula. Anterior subluxation of the distal tibia relative to the talus appears similar to postoperative radiographs 01/14/2012. There are remote healed fracture deformities of the distal tibia and distal fibula. And the tibiotalar joint appears to have bony fusion posteriorly. The bones appear diffusely mottled in appearance with overall decreased bony mineralization, likely due to disuse osteopenia.  On the lateral view, 2 screws do appear to extend anteriorly into the subcutaneous soft tissues. There is soft tissue swelling in this region anteriorly. Due to differences in patient positioning between the current radiographs and the postoperative radiographs of 2013 it is difficult to tell if the screw position has changed over time.  IMPRESSION: 1. Postoperative changes of ORIF of the distal tibia and distal fibula. On the lateral projection, 2 of the screws appear to extend anteriorly into the subcutaneous soft tissues. Due to differences in patient  positioning compared to postoperative radiographs of 2013, it is difficult to tell if this is an interval change or a stable appearance. 2. Soft tissue swelling of the anterior ankle region. 3. Chronic appearing anterior subluxation of the tibiotalar joint. 4. 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   Electronically Signed   By: Britta MccreedySusan  Turner M.D.   On: 08/06/2013 18:24     EKG Interpretation None      MDM   Final diagnoses:  None   Blima DessertGeorge M Corriher is a 80 y.o. male here with R ankle pain. There is cellulitis and I am concerned for possible hard ware infection. Will get xray and check labs.   7:42 PM I called Dr. Magnus IvanBlackman, covering Dr. Ophelia CharterYates. He said that given that he has no fever and doesn't appear septic can load with IV clinda and f/u next week with Dr. Ophelia CharterYates for revision surgery.     Richardean Canalavid H Lacreshia Bondarenko, MD 08/06/13 [phone]

## 2013-08-06 NOTE — ED Notes (Signed)
[facility] contacted for transport back to [facility]

## 2013-08-06 NOTE — Discharge Instructions (Signed)
Take clindamycin for a week.   See [name] early next week.   Return to [facility] if you have fever, worse redness and swelling.

## 2013-10-19 ENCOUNTER — Encounter: Payer: Self-pay | Admitting: Internal Medicine

## 2013-10-19 ENCOUNTER — Ambulatory Visit (INDEPENDENT_AMBULATORY_CARE_PROVIDER_SITE_OTHER): Payer: Medicare Other | Admitting: Internal Medicine

## 2013-10-19 VITALS — BP 150/71 | HR 70 | Ht 72.0 in

## 2013-10-19 DIAGNOSIS — I4891 Unspecified atrial fibrillation: Secondary | ICD-10-CM

## 2013-10-19 DIAGNOSIS — Z950 Presence of cardiac pacemaker: Secondary | ICD-10-CM

## 2013-10-19 DIAGNOSIS — J309 Allergic rhinitis, unspecified: Secondary | ICD-10-CM

## 2013-10-19 DIAGNOSIS — I442 Atrioventricular block, complete: Secondary | ICD-10-CM

## 2013-10-19 DIAGNOSIS — I428 Other cardiomyopathies: Secondary | ICD-10-CM

## 2013-10-19 LAB — MDC_IDC_ENUM_SESS_TYPE_INCLINIC
Battery Voltage: 2.96 V
Date Time Interrogation Session: 20150527145403
Implantable Pulse Generator Model: 1110
Lead Channel Pacing Threshold Amplitude: 1.25 V
Lead Channel Pacing Threshold Pulse Width: 0.4 ms
Lead Channel Sensing Intrinsic Amplitude: 10.4 mV
Lead Channel Setting Sensing Sensitivity: 2 mV
MDC IDC MSMT BATTERY REMAINING LONGEVITY: 134.4 mo
MDC IDC MSMT LEADCHNL RV IMPEDANCE VALUE: 487.5 Ohm
MDC IDC MSMT LEADCHNL RV PACING THRESHOLD AMPLITUDE: 1.25 V
MDC IDC MSMT LEADCHNL RV PACING THRESHOLD PULSEWIDTH: 0.4 ms
MDC IDC PG SERIAL: 2309283
MDC IDC SET LEADCHNL RV PACING AMPLITUDE: 2.5 V
MDC IDC SET LEADCHNL RV PACING PULSEWIDTH: 0.4 ms
MDC IDC STAT BRADY RV PERCENT PACED: 40 %

## 2013-10-19 NOTE — Progress Notes (Addendum)
Patient Care Team: Minda Meoichard A Aronson, MD as PCP - General (Internal Medicine)   HPI  Blima DessertGeorge M [name] is a 80 y.o. male Seen in followup for atrial fibrillation for which he is status post AV junction ablation; there has been resumption of antegrade conduction. He is status post pacemaker implantation. His history of a nonischemic myopathy with interval improvement from 33--50%  Has history of subdural hematoma; hence he does not take Coumadin  Last cath 01/2008 demonstrated luminal irregs. Echo in 04/2009: EF 50-55%, mod LVH, mild AI, mild MVP of post leaflet with mod MR, severe BAE and PASP 45.   He is no longer ambulatory  Denies shortness of breath or chest pain  Past Medical History  Diagnosis Date  . Permanent atrial fibrillation     NOT CANDIDATE FOR COUMADIN  . Bradycardia     AV JUNCTION ABLATION W/RESUMPTION ON CONDUCTION S/P StJUDE PACEMAKER-2011 REVISION/St.JUDE ACCENT SR  . Hypertension   . Subdural hematoma 2003    bilateral, WHILE ON COUMADIN  . Colitis   . Cardiomyopathy     EF had been 35% (nonischemic, cath 09).  Last echo 2010 EF 55%  . Mitral regurgitation     Moderate  . Arthritis   . Depression   . Congestive heart failure   . Atrial fibrillation   . Vitamin D deficiency   . Pneumonia 01/13/12    being treated for pneumonia  . Pacemaker     Past Surgical History  Procedure Laterality Date  . Subdural hematoma evacuation via craniotomy    . Cystectomy      BREAST, NECK  . Hand surgery    . Insert / replace / remove pacemaker      PACEMAKER IMPLANT 2003-2010.Marland Kitchen.Marland Kitchen.REVISION St, JUDE ACCENT SR  . Tonsillectomy and adenoidectomy    . Orif ankle fracture  01/04/2012    Procedure: OPEN REDUCTION INTERNAL FIXATION (ORIF) ANKLE FRACTURE;  Surgeon: Eldred MangesMark C Yates, MD;  Location: [facility];  Service: Orthopedics;  Laterality: Right;  bilateral fixation trimalleolar fixation  . Orif ankle fracture  01/14/2012    Procedure: OPEN REDUCTION INTERNAL FIXATION  (ORIF) ANKLE FRACTURE;  Surgeon: Eldred MangesMark C Yates, MD;  Location: [facility];  Service: Orthopedics;  Laterality: Right;  Right Ankle Fracture Revision    Current Outpatient Prescriptions  Medication Sig Dispense Refill  . acetaminophen (TYLENOL) 325 MG tablet Take 650 mg by mouth every 6 (six) hours as needed. 2 tablets by mouth every six hours as needed      . aspirin 81 MG EC tablet Take 81 mg by mouth daily.        Marland Kitchen. CALCIUM-VITAMIN D PO Take by mouth daily. 600/800 mg      . carvedilol (COREG) 25 MG tablet Take 25 mg by mouth 2 (two) times daily with a meal.       . diphenhydrAMINE (BENADRYL) 25 mg capsule Take 25 mg by mouth every 6 (six) hours as needed.      . docusate sodium (COLACE) 100 MG capsule Take 100 mg by mouth 2 (two) times daily.      . enalapril (VASOTEC) 10 MG tablet Take 10 mg by mouth daily.        Marland Kitchen. escitalopram (LEXAPRO) 10 MG tablet Take 10 mg by mouth daily.       . fish oil-omega-3 fatty acids 1000 MG capsule Take 3 g by mouth daily.       . folic acid (FOLVITE) 400 MCG  tablet Take 800 mcg by mouth 3 (three) times daily. 2 tablets by mouth three times daily      . furosemide (LASIX) 40 MG tablet Take 40 mg by mouth daily.      Marland Kitchen ipratropium-albuterol (DUONEB) 0.5-2.5 (3) MG/3ML SOLN Take 3 mLs by nebulization every 6 (six) hours as needed.      . mirtazapine (REMERON) 7.5 MG tablet Take 7.5 mg by mouth at bedtime.      . Multiple Vitamin (MULTIVITAMIN) tablet Take 1 tablet by mouth daily.        Marland Kitchen omeprazole (PRILOSEC OTC) 20 MG tablet Take 20 mg by mouth daily.      Marland Kitchen oxyCODONE-acetaminophen (PERCOCET/ROXICET) 5-325 MG per tablet Take 1 tablet by mouth every 4 (four) hours as needed.      Bertram Gala Glycol-Propyl Glycol (SYSTANE) 0.4-0.3 % SOLN Place 1 drop into both eyes 2 (two) times daily.      . polyethylene glycol (MIRALAX / GLYCOLAX) packet Take 17 g by mouth daily.      . potassium chloride SA (K-DUR,KLOR-CON) 20 MEQ tablet Take 20 mEq by mouth daily.      .  promethazine (PHENERGAN) 25 MG tablet Take 25 mg by mouth every 6 (six) hours as needed for nausea or vomiting.      . pyridOXINE (VITAMIN B-6) 50 MG tablet Take 50 mg by mouth daily.       No current facility-administered medications for this visit.    Allergies  Allergen Reactions  . Celecoxib Other (See Comments)    Stomach bleed  . Warfarin Sodium Other (See Comments)    Causes heart rate, pace maker  . Zaroxolyn [Metolazone] Other (See Comments)    seizure    Review of Systems negative except from HPI and PMH  Physical Exam BP 150/71  Pulse 70  Ht 6' (1.829 m) Well developed and well nourished in no acute distress lying in a stretcher HENT normal E scleral and icterus clear Neck Supple JVP flat; carotids brisk and full Clear to ausculation  Irr egular rate and rhythm,3/6 m>>apex Soft with active bowel sounds No clubbing cyanosis  brawny Edema Alert and oriented Skin Warm and Dry  ECG demonstrates atrial fibrillation at 70 with intermittent ventricular pacing inferior T wave abnormalities  Intervals-/10/40  Assessment and  Plan  Atrial fibrillation  Prior CVA  Pacemaker-St Jude The patient's device was interrogated.  The information was reviewed. No changes were made in the programming.     Allergic rhinitis  From a cardiac point of view he is stable. I will ask  [name] whether he might be a candidate for Flonase as nasal congestion and allergies seem to be the biggest complaint is that he has. He is currently being treated with diphenhydramine which has a little bit of tendency towards sedation  Not on anticoagulation 2/2 subdural hematoma

## 2013-10-19 NOTE — Patient Instructions (Signed)

## 2013-10-26 ENCOUNTER — Ambulatory Visit (INDEPENDENT_AMBULATORY_CARE_PROVIDER_SITE_OTHER): Payer: Medicare Other | Admitting: Podiatry

## 2013-10-26 ENCOUNTER — Encounter: Payer: Self-pay | Admitting: Podiatry

## 2013-10-26 VITALS — BP 124/69 | HR 86 | Resp 12

## 2013-10-26 DIAGNOSIS — M79609 Pain in unspecified limb: Secondary | ICD-10-CM

## 2013-10-26 DIAGNOSIS — B351 Tinea unguium: Secondary | ICD-10-CM

## 2013-10-26 NOTE — Progress Notes (Signed)
   Subjective:    Patient ID: John [name], male    DOB: [birth year], 80 y.o.   MRN: [id number]  HPI           TOENAILS TRIM.   Review of Systems  Constitutional: Positive for unexpected weight change.  Musculoskeletal: Positive for gait problem.  Hematological: Bruises/bleeds easily.  All other systems reviewed and are negative.  This patient was transported to the office by EMS on a stretcher with wife who is requesting nail debridement. The last visit for similar service was by DR. Regal on 12/01/2011.     Objective:   Physical Exam  80 year old white male appears response with wife present. He is on a stretcher in a supine position.  Dermatological: Atrophic skin noted bilaterally Hypertrophic, brittle, discolored toenails x10  Musculoskeletal: Hammertoe deformities 2 through 5 bilaterally       Assessment & Plan:   Assessment: Neglected symptomatic onychomycoses x10  Plan: Debrided toenails x10 without a bleeding  Reappoint at three-month intervals

## 2013-11-01 ENCOUNTER — Encounter: Payer: Self-pay | Admitting: Infectious Disease

## 2013-11-01 ENCOUNTER — Ambulatory Visit (INDEPENDENT_AMBULATORY_CARE_PROVIDER_SITE_OTHER): Payer: Medicare Other | Admitting: Infectious Disease

## 2013-11-01 VITALS — BP 114/71 | HR 90 | Temp 97.4°F

## 2013-11-01 DIAGNOSIS — M869 Osteomyelitis, unspecified: Secondary | ICD-10-CM | POA: Insufficient documentation

## 2013-11-01 DIAGNOSIS — Z889 Allergy status to unspecified drugs, medicaments and biological substances status: Secondary | ICD-10-CM

## 2013-11-01 DIAGNOSIS — T847XXA Infection and inflammatory reaction due to other internal orthopedic prosthetic devices, implants and grafts, initial encounter: Secondary | ICD-10-CM

## 2013-11-01 DIAGNOSIS — L0390 Cellulitis, unspecified: Secondary | ICD-10-CM

## 2013-11-01 DIAGNOSIS — L0291 Cutaneous abscess, unspecified: Secondary | ICD-10-CM

## 2013-11-01 DIAGNOSIS — M009 Pyogenic arthritis, unspecified: Secondary | ICD-10-CM

## 2013-11-01 DIAGNOSIS — Z950 Presence of cardiac pacemaker: Secondary | ICD-10-CM

## 2013-11-01 LAB — CBC WITH DIFFERENTIAL/PLATELET
BASOS ABS: 0 10*3/uL (ref 0.0–0.1)
Basophils Relative: 0 % (ref 0–1)
EOS PCT: 4 % (ref 0–5)
Eosinophils Absolute: 0.2 10*3/uL (ref 0.0–0.7)
HEMATOCRIT: 34.9 % — AB (ref 39.0–52.0)
HEMOGLOBIN: 11.7 g/dL — AB (ref 13.0–17.0)
LYMPHS ABS: 1.2 10*3/uL (ref 0.7–4.0)
LYMPHS PCT: 23 % (ref 12–46)
MCH: 28.5 pg (ref 26.0–34.0)
MCHC: 33.5 g/dL (ref 30.0–36.0)
MCV: 84.9 fL (ref 78.0–100.0)
MONO ABS: 0.5 10*3/uL (ref 0.1–1.0)
MONOS PCT: 9 % (ref 3–12)
NEUTROS ABS: 3.3 10*3/uL (ref 1.7–7.7)
Neutrophils Relative %: 64 % (ref 43–77)
Platelets: 223 10*3/uL (ref 150–400)
RBC: 4.11 MIL/uL — AB (ref 4.22–5.81)
RDW: 15.6 % — AB (ref 11.5–15.5)
WBC: 5.1 10*3/uL (ref 4.0–10.5)

## 2013-11-01 LAB — COMPLETE METABOLIC PANEL WITH GFR
ALBUMIN: 3.2 g/dL — AB (ref 3.5–5.2)
ALK PHOS: 50 U/L (ref 39–117)
ALT: 9 U/L (ref 0–53)
AST: 16 U/L (ref 0–37)
BILIRUBIN TOTAL: 0.8 mg/dL (ref 0.2–1.2)
BUN: 15 mg/dL (ref 6–23)
CO2: 30 mEq/L (ref 19–32)
CREATININE: 0.53 mg/dL (ref 0.50–1.35)
Calcium: 8.7 mg/dL (ref 8.4–10.5)
Chloride: 103 mEq/L (ref 96–112)
GFR, Est African American: >89 mL/min
GLUCOSE: 89 mg/dL (ref 70–99)
POTASSIUM: 3.6 meq/L (ref 3.5–5.3)
Sodium: 139 mEq/L (ref 135–145)
Total Protein: 6.4 g/dL (ref 6.0–8.3)

## 2013-11-01 LAB — C-REACTIVE PROTEIN: CRP: 0.8 mg/dL — AB (ref <0.60)

## 2013-11-01 LAB — SEDIMENTATION RATE: Sed Rate: 12 mm/hr (ref 0–16)

## 2013-11-01 MED ORDER — CEPHALEXIN 500 MG PO CAPS: 500.0000 mg | ORAL_CAPSULE | Freq: Four times a day (QID) | ORAL | Status: DC

## 2013-11-01 MED ORDER — SULFAMETHOXAZOLE-TMP DS 800-160 MG PO TABS: 1.0000 | ORAL_TABLET | Freq: Two times a day (BID) | ORAL | Status: DC

## 2013-11-01 NOTE — Progress Notes (Signed)
Subjective:    Patient ID: John [name], male    DOB: Jun 21, 1923, 80 y.o.   MRN: [id number]  HPI  John [name] is a 80 year old man with past medical history significant for dilated cardiomyopathy with A-V block status post pacemaker placement then followed by [name], mitral regurgitation also with a history of seizures followed by [name] for primary care at [facility]. He had an ankle fracture that he sustained in 2003 it was fixed with hardware. This was revised in 2013. Unfortunately there was increasing erythema and drainage in the ankle and eventually hardware began to protrude through the skin. He was taken to the operating room by [name] this may and he performed an incision and debridement and removal of most of the hardware where except for 2 pins.  Do not have access to those records as the surgery was done at the surgical Center. I do not know if cultures were obtained in the operating room or the patient on antibiotics prior to potential cultures having been obtained.  Regardless he was apparently treated with intravenous vancomycin by [name] with improvement in his surgical site postoperatively after removal of hardware. He then developed erythema around the lateral aspect of the wound with drainage and weeping. He was given various courses of antibiotics including clindamycin and doxycycline. He had a rash with doxycycline cycle and apparently had some type of allergic reaction to the oral clindamycin. He has been seen by [name] and placed back on intravenous vancomycin which he received up into Sunday. His PICC line stopped functioning and he was given his last dose peripherally 2 days ago.  He is now apparently been placed on Keflex although I do not have the dosing schema here as his MAR does not have documentation of Keflex.  The area near the mass stabilizes and then.  He is without fevers chills nausea or systemic symptoms.  Patient was  referred to [place] for evaluation of this persistent erythema and with concern for infection and and in particular I have concern for deep infection given the presence of residual hardware.    Review of Systems  Constitutional: Positive for fatigue. Negative for fever, chills, diaphoresis, activity change, appetite change and unexpected weight change.  HENT: Negative for trouble swallowing.   Respiratory: Negative for cough and wheezing.   Cardiovascular: Negative for chest pain.  Gastrointestinal: Negative for nausea, vomiting, diarrhea and abdominal distention.  Genitourinary: Negative for dysuria.  Musculoskeletal: Positive for arthralgias.  Skin: Positive for rash and wound.  Neurological: Negative for headaches.  Psychiatric/Behavioral: Negative for behavioral problems, confusion and decreased concentration.       Objective:   Physical Exam  Constitutional: He is oriented to person, place, and time. He appears well-developed and well-nourished.  HENT:  Head: Normocephalic and atraumatic.  Eyes: Conjunctivae and EOM are normal.  Neck: Normal range of motion. Neck supple. No tracheal deviation present.  Cardiovascular: Normal rate and regular rhythm.   Murmur heard. Pulmonary/Chest: Breath sounds normal. No respiratory distress. He has no wheezes. He has no rales.  Abdominal: Soft. Bowel sounds are normal. He exhibits no distension and no mass. There is no tenderness.  Musculoskeletal: He exhibits edema.  Neurological: He is alert and oriented to person, place, and time.  Skin: Skin is warm. There is erythema.  Psychiatric: He has a normal mood and affect. His behavior is normal. Judgment and thought content normal.    Right ankle medial wound:  Right ankle lateral wound:          Assessment & Plan:   80 year old man with pacemaker and ankle hardware infection status post removal of the majority of hardware status post vancomycin therapy now with recurrent  erythema and weeping of the skin at times.  #1 recurrent erythema and weeping of skin at site where he had a hardware infection:  I would Like it records and from Dr. Ophelia CharterYates with regards to the surgery and a potential cultures that have been done.  Fine continue him on Keflex for now.  We will check a sedimentation rate C-reactive protein metabolic panel and CBC today.  Will check a CT with contrast of the right ankle.  To remove his PICC line is not functioning  I spent greater than 60 minutes with the patient including greater than 50% of time in face to face counsel of the patient and in coordination of their care.    Mx drug allergies: including to doxy and clindamycin  Pace maker: is clean no evidence for infection.  We're getting a CT scan of his leg due to concerns that the pacemaker may not be one that can be used with an MRI machine.

## 2013-11-04 ENCOUNTER — Emergency Department (HOSPITAL_COMMUNITY): Payer: Medicare Other

## 2013-11-04 ENCOUNTER — Emergency Department (HOSPITAL_COMMUNITY)
Admission: EM | Admit: 2013-11-04 | Discharge: 2013-11-04 | Disposition: A | Discharge status: Home / Self Care | Payer: Medicare Other | Attending: Emergency Medicine | Admitting: Emergency Medicine

## 2013-11-04 ENCOUNTER — Encounter (HOSPITAL_COMMUNITY): Payer: Self-pay | Admitting: Emergency Medicine

## 2013-11-04 ENCOUNTER — Telehealth: Payer: Self-pay | Admitting: *Deleted

## 2013-11-04 DIAGNOSIS — F3289 Other specified depressive episodes: Secondary | ICD-10-CM | POA: Insufficient documentation

## 2013-11-04 DIAGNOSIS — I509 Heart failure, unspecified: Secondary | ICD-10-CM | POA: Insufficient documentation

## 2013-11-04 DIAGNOSIS — L02419 Cutaneous abscess of limb, unspecified: Secondary | ICD-10-CM | POA: Insufficient documentation

## 2013-11-04 DIAGNOSIS — Z8719 Personal history of other diseases of the digestive system: Secondary | ICD-10-CM | POA: Insufficient documentation

## 2013-11-04 DIAGNOSIS — Z87891 Personal history of nicotine dependence: Secondary | ICD-10-CM | POA: Insufficient documentation

## 2013-11-04 DIAGNOSIS — Z8701 Personal history of pneumonia (recurrent): Secondary | ICD-10-CM | POA: Insufficient documentation

## 2013-11-04 DIAGNOSIS — I10 Essential (primary) hypertension: Secondary | ICD-10-CM | POA: Insufficient documentation

## 2013-11-04 DIAGNOSIS — L0390 Cellulitis, unspecified: Secondary | ICD-10-CM

## 2013-11-04 DIAGNOSIS — Z950 Presence of cardiac pacemaker: Secondary | ICD-10-CM | POA: Insufficient documentation

## 2013-11-04 DIAGNOSIS — I4891 Unspecified atrial fibrillation: Secondary | ICD-10-CM | POA: Insufficient documentation

## 2013-11-04 DIAGNOSIS — Z7982 Long term (current) use of aspirin: Secondary | ICD-10-CM | POA: Insufficient documentation

## 2013-11-04 DIAGNOSIS — L03119 Cellulitis of unspecified part of limb: Principal | ICD-10-CM

## 2013-11-04 DIAGNOSIS — M129 Arthropathy, unspecified: Secondary | ICD-10-CM | POA: Insufficient documentation

## 2013-11-04 DIAGNOSIS — E559 Vitamin D deficiency, unspecified: Secondary | ICD-10-CM | POA: Insufficient documentation

## 2013-11-04 DIAGNOSIS — Z79899 Other long term (current) drug therapy: Secondary | ICD-10-CM | POA: Insufficient documentation

## 2013-11-04 DIAGNOSIS — F329 Major depressive disorder, single episode, unspecified: Secondary | ICD-10-CM | POA: Insufficient documentation

## 2013-11-04 LAB — COMPREHENSIVE METABOLIC PANEL
ALBUMIN: 3.3 g/dL — AB (ref 3.5–5.2)
ALK PHOS: 68 U/L (ref 39–117)
ALT: 10 U/L (ref 0–53)
AST: 17 U/L (ref 0–37)
BUN: 23 mg/dL (ref 6–23)
CHLORIDE: 105 meq/L (ref 96–112)
CO2: 31 mEq/L (ref 19–32)
Calcium: 9.5 mg/dL (ref 8.4–10.5)
Creatinine, Ser: 0.72 mg/dL (ref 0.50–1.35)
GFR calc Af Amer: >90 mL/min (ref >90)
GFR calc non Af Amer: 81 mL/min — ABNORMAL LOW (ref >90)
Glucose, Bld: 95 mg/dL (ref 70–99)
POTASSIUM: 4.7 meq/L (ref 3.7–5.3)
Sodium: 146 mEq/L (ref 137–147)
Total Bilirubin: 0.5 mg/dL (ref 0.3–1.2)
Total Protein: 7.5 g/dL (ref 6.0–8.3)

## 2013-11-04 LAB — CBC WITH DIFFERENTIAL/PLATELET
BASOS ABS: 0 10*3/uL (ref 0.0–0.1)
BASOS PCT: 1 % (ref 0–1)
Eosinophils Absolute: 0.4 10*3/uL (ref 0.0–0.7)
Eosinophils Relative: 7 % — ABNORMAL HIGH (ref 0–5)
HCT: 36.5 % — ABNORMAL LOW (ref 39.0–52.0)
HEMOGLOBIN: 12 g/dL — AB (ref 13.0–17.0)
Lymphocytes Relative: 21 % (ref 12–46)
Lymphs Abs: 1.3 10*3/uL (ref 0.7–4.0)
MCH: 29.3 pg (ref 26.0–34.0)
MCHC: 32.9 g/dL (ref 30.0–36.0)
MCV: 89.2 fL (ref 78.0–100.0)
Monocytes Absolute: 0.6 10*3/uL (ref 0.1–1.0)
Monocytes Relative: 10 % (ref 3–12)
NEUTROS ABS: 3.8 10*3/uL (ref 1.7–7.7)
Neutrophils Relative %: 61 % (ref 43–77)
Platelets: 211 10*3/uL (ref 150–400)
RBC: 4.09 MIL/uL — ABNORMAL LOW (ref 4.22–5.81)
RDW: 15.5 % (ref 11.5–15.5)
WBC: 6.1 10*3/uL (ref 4.0–10.5)

## 2013-11-04 MED ORDER — VANCOMYCIN HCL 10 G IV SOLR
1500.0000 mg | Freq: Once | INTRAVENOUS | Status: AC
  Administered 2013-11-04: 1500 mg via INTRAVENOUS
  Filled 2013-11-04: qty 1500

## 2013-11-04 MED ORDER — SODIUM CHLORIDE 0.9 % IV BOLUS (SEPSIS)
1000.0000 mL | Freq: Once | INTRAVENOUS | Status: AC
  Administered 2013-11-04: 1000 mL via INTRAVENOUS

## 2013-11-04 MED ORDER — IOHEXOL 300 MG/ML  SOLN
80.0000 mL | Freq: Once | INTRAMUSCULAR | Status: AC | PRN
  Administered 2013-11-04: 80 mL via INTRAVENOUS

## 2013-11-04 NOTE — ED Notes (Signed)
Pt returned from CT °

## 2013-11-04 NOTE — Telephone Encounter (Signed)
right ankle erythema has moved up to the knee from 11/01/13, warm to touch, dry crusted exudate at incision sites.  Pt c/o itching at the ankle.  Dr. Daiva Eves recommened pt go to Hospital for evaluation.  Recommended the pt be started on IV Vancomycin.  [facility] notified of Dr. Zenaida Niece Dam's recommendation to go the the Hospital.  [facility] said that they would take him to [facility].

## 2013-11-04 NOTE — ED Notes (Signed)
Per [facility], pt was told to come in to ED for worsening symptoms of R leg infection. Pt was d/c from PICC and IV antibiotics on Tuesday the 9th and switched to PO, since then has noticed worsening redness and inflammation in affected leg.

## 2013-11-04 NOTE — ED Provider Notes (Signed)
CSN: [id number]     Arrival date & time 11/04/13  1630 History   First MD Initiated Contact with Patient 11/04/13 1701     Chief Complaint  Patient presents with  . Cellulitis     (Consider location/radiation/quality/duration/timing/severity/associated sxs/prior Treatment) Patient is a 80 y.o. male presenting with leg pain.  Leg Pain Location:  Leg Leg location:  R lower leg Pain details:    Quality:  Sharp   Radiates to:  Does not radiate   Severity:  Mild   Onset quality:  Gradual   Duration:  3 days   Timing:  Constant   Progression:  Worsening Chronicity:  Recurrent Prior injury to area:  Yes Worsened by:  Nothing tried Ineffective treatments:  None tried Associated symptoms: swelling   Associated symptoms: no back pain, no fatigue, no fever, no neck pain, no numbness and no tingling     Past Medical History  Diagnosis Date  . Permanent atrial fibrillation     NOT CANDIDATE FOR COUMADIN  . Bradycardia     AV JUNCTION ABLATION W/RESUMPTION ON CONDUCTION S/P StJUDE PACEMAKER-2011 REVISION/St.JUDE ACCENT SR  . Hypertension   . Subdural hematoma 2003    bilateral, WHILE ON COUMADIN  . Colitis   . Cardiomyopathy     EF had been 35% (nonischemic, cath 09).  Last echo 2010 EF 55%  . Mitral regurgitation     Moderate  . Arthritis   . Depression   . Congestive heart failure   . Atrial fibrillation   . Vitamin D deficiency   . Pneumonia 01/13/12    being treated for pneumonia  . Pacemaker    Past Surgical History  Procedure Laterality Date  . Subdural hematoma evacuation via craniotomy    . Cystectomy      BREAST, NECK  . Hand surgery    . Insert / replace / remove pacemaker      PACEMAKER IMPLANT 2003-2010.Marland KitchenMarland KitchenREVISION St, JUDE ACCENT SR  . Tonsillectomy and adenoidectomy    . Orif ankle fracture  01/04/2012    Procedure: OPEN REDUCTION INTERNAL FIXATION (ORIF) ANKLE FRACTURE;  Surgeon: Eldred Manges, MD;  Location: [facility];  Service: Orthopedics;  Laterality:  Right;  bilateral fixation trimalleolar fixation  . Orif ankle fracture  01/14/2012    Procedure: OPEN REDUCTION INTERNAL FIXATION (ORIF) ANKLE FRACTURE;  Surgeon: Eldred Manges, MD;  Location: [facility];  Service: Orthopedics;  Laterality: Right;  Right Ankle Fracture Revision   Family History  Problem Relation Age of Onset  . Leukemia Father   . Heart disease Father     angina  . Stroke Mother    History  Substance Use Topics  . Smoking status: Former Smoker    Types: Cigarettes  . Smokeless tobacco: Not on file  . Alcohol Use: No    Review of Systems  Constitutional: Negative for fever, chills and fatigue.  HENT: Negative for congestion and rhinorrhea.   Eyes: Negative for pain.  Respiratory: Negative for cough and shortness of breath.   Cardiovascular: Negative for chest pain and palpitations.  Gastrointestinal: Negative for vomiting, abdominal pain, diarrhea and constipation.  Endocrine: Negative for polydipsia and polyuria.  Genitourinary: Negative for dysuria and flank pain.  Musculoskeletal: Negative for back pain and neck pain.  Skin: Positive for color change. Negative for wound.  Neurological: Negative for dizziness, numbness and headaches.      Allergies  Celecoxib; Doxycycline; Warfarin and related; Warfarin sodium; and Zaroxolyn  Home Medications   Prior  to Admission medications   Medication Sig Start Date End Date Taking? Authorizing Provider  aspirin 81 MG EC tablet Take 81 mg by mouth daily.    Yes Historical Provider, MD  CALCIUM-VITAMIN D PO Take 1 tablet by mouth daily. 600/800 mg   Yes Historical Provider, MD  carvedilol (COREG) 25 MG tablet Take 25 mg by mouth 2 (two) times daily with a meal.  05/14/09  Yes Historical Provider, MD  diphenhydrAMINE (BENADRYL) 25 mg capsule Take 25 mg by mouth every 6 (six) hours as needed for itching.    Yes Historical Provider, MD  docusate sodium (COLACE) 100 MG capsule Take 100 mg by mouth 2 (two) times daily.   Yes  Historical Provider, MD  enalapril (VASOTEC) 10 MG tablet Take 10 mg by mouth daily.     Yes Historical Provider, MD  enalapril (VASOTEC) 10 MG tablet Take 10 mg by mouth daily.  06/23/09  Yes Historical Provider, MD  escitalopram (LEXAPRO) 10 MG tablet Take 10 mg by mouth daily.    Yes Historical Provider, MD  folic acid (FOLVITE) 400 MCG tablet Take 800 mcg by mouth 3 (three) times daily. 2 tablets by mouth three times daily   Yes Historical Provider, MD  furosemide (LASIX) 40 MG tablet Take 40 mg by mouth daily.  04/15/09  Yes Historical Provider, MD  ipratropium-albuterol (DUONEB) 0.5-2.5 (3) MG/3ML SOLN Take 3 mLs by nebulization every 6 (six) hours as needed. For wheezing or shortness of breath   Yes Historical Provider, MD  Krill Oil 300 MG CAPS Take 3 capsules by mouth daily.   Yes Historical Provider, MD  magnesium oxide (MAG-OX) 400 MG tablet Take 400 mg by mouth daily.   Yes Historical Provider, MD  mirtazapine (REMERON) 7.5 MG tablet Take 7.5 mg by mouth at bedtime.   Yes Historical Provider, MD  Multiple Vitamin (MULTIVITAMIN) tablet Take 1 tablet by mouth daily.     Yes Historical Provider, MD  omeprazole (PRILOSEC OTC) 20 MG tablet Take 20 mg by mouth daily. 11/16/12  Yes Historical Provider, MD  oxyCODONE-acetaminophen (PERCOCET/ROXICET) 5-325 MG per tablet Take 1 tablet by mouth every 4 (four) hours as needed for moderate pain.    Yes Historical Provider, MD  Polyethyl Glycol-Propyl Glycol (SYSTANE) 0.4-0.3 % SOLN Place 1 drop into both eyes 2 (two) times daily.   Yes Historical Provider, MD  polyethylene glycol (MIRALAX / GLYCOLAX) packet Take 17 g by mouth daily.   Yes Historical Provider, MD  potassium chloride SA (K-DUR,KLOR-CON) 20 MEQ tablet Take 20 mEq by mouth daily.   Yes Historical Provider, MD  promethazine (PHENERGAN) 25 MG suppository Place 25 mg rectally every 6 (six) hours as needed for nausea or vomiting.   Yes Historical Provider, MD  promethazine (PHENERGAN) 25 MG  tablet Take 25 mg by mouth every 6 (six) hours as needed for nausea or vomiting.   Yes Historical Provider, MD  pyridOXINE (VITAMIN B-6) 50 MG tablet Take 50 mg by mouth daily.   Yes Historical Provider, MD  acetaminophen (TYLENOL) 325 MG tablet Take 650 mg by mouth every 6 (six) hours as needed for fever. 2 tablets by mouth every six hours as needed    Historical Provider, MD  cephALEXin (KEFLEX) 500 MG capsule Take 500 mg by mouth See admin instructions. Take 1 tablet by mouth q6h for 7 days.. According to the [facility] looks like the patient started on 10-31-13. 11/01/13   Randall Hiss, MD   BP 132/86  Pulse 78  Temp(Src) 98 F (36.7 C) (Oral)  Resp 23  SpO2 99% Physical Exam  Nursing note and vitals reviewed. Constitutional: He is oriented to person, place, and time. He appears well-developed and well-nourished.  HENT:  Head: Normocephalic and atraumatic.  Eyes: Conjunctivae and EOM are normal. Pupils are equal, round, and reactive to light.  Neck: Normal range of motion.  Cardiovascular: Normal rate and regular rhythm.   Pulmonary/Chest: Effort normal and breath sounds normal.  Abdominal: Soft. He exhibits no distension. There is no tenderness.  Musculoskeletal: Normal range of motion. He exhibits edema and tenderness.  Neurological: He is alert and oriented to person, place, and time.  Skin: Skin is warm and dry. There is erythema (lateral side of lower part of RLE).    ED Course  Procedures (including critical care time) Labs Review Labs Reviewed  CBC WITH DIFFERENTIAL - Abnormal; Notable for the following:    RBC 4.09 (*)    Hemoglobin 12.0 (*)    HCT 36.5 (*)    Eosinophils Relative 7 (*)    All other components within normal limits  COMPREHENSIVE METABOLIC PANEL - Abnormal; Notable for the following:    Albumin 3.3 (*)    GFR calc non Af Amer 81 (*)    All other components within normal limits    Imaging Review Ct Ankle Right W Contrast  11/04/2013   CLINICAL DATA:   Right ankle erythema and inflammation. Evaluate for hardware infection.  EXAM: CT OF THE RIGHT  ANKLE WITH CONTRAST  TECHNIQUE: Multidetector CT imaging of the ankle was performed following the standard protocol during bolus administration of intravenous contrast.  CONTRAST:  35mL OMNIPAQUE IOHEXOL 300 MG/ML  SOLN  COMPARISON:  Radiographs 08/06/2013, 01/14/2012 and 01/04/2012  FINDINGS: Most of the previously demonstrated hardware has been removed in the interval. There is a single cortical screw traversing the tibial plafond and a single residual screw within the distal fibula. There is no significant focal lucency surrounding the screws or hardware displacement.  The bones are diffusely demineralized. There is posttraumatic deformity of the distal tibia and fibula. The tibial plafond demonstrates significant articular surface irregularity and depression posteriorly, best seen on the sagittal images. There is approximately 1.5 cm of posterior subluxation of the talus with respect to the tibial plafond.  No acute fracture, frank dislocation or gross bone destruction is seen.  The lower leg musculature appears diffusely atrophied. The ankle tendons appear intact. There is no large joint effusion or focal soft tissue swelling.  IMPRESSION: 1. Interval removal of the lateral fibular plate and multiple cortical screws from the distal fibula and tibia. 2. The remaining cortical screws demonstrate no surrounding lucency or displacement. 3. Marked generalized osseous demineralization without evidence of focal bone destruction to suggest osteomyelitis. No evidence of soft tissue abscess. 4. Posttraumatic deformity of the tibial plafond posteriorly and chronic posterior subluxation of the talus.   Electronically Signed   By: Roxy Horseman M.D.   On: 11/04/2013 19:11     EKG Interpretation   Date/Time:  Friday November 04 2013 16:58:46 EDT Ventricular Rate:  75 PR Interval:    QRS Duration: 188 QT Interval:  483 QTC  Calculation: 540 R Axis:   -64 Text Interpretation:  Age not entered, assumed to be  80 years old for  purpose of ECG interpretation Atrial fibrillation with intermittent paced  rhythm Confirmed by DOCHERTY  MD, MEGAN [phone]) on 11/04/2013 5:15:11 PM      MDM  Final diagnoses:  Cellulitis    80 yo M w/ multiple medic4al problems, most recently with hardware infection of right ankle s/p removal in march. Was on vanc via PICC line until earlier this week when he was switched to PO keflex since that time the cellulitis on his right leg has progressively worsened. No systemic symptoms, no distress or evidence of sepsis here. Labs acceptable, no evidence of osteo or deep infection on CT. Spoke with ortho who said if no obvious hardware complication then should admit to primary team. Spoke with PCP office on call who recommending d/c with PICC line in place (or IV if no PICC available) and he could get vanc at his nursing facility. As patient was not septic and physician could see him in the morning I felt that was a reasonable and safe plan. A dose of vancomycin was given here and he was d/c to facility with IV in place.     Marily MemosJason Leili Eskenazi, MD 11/05/13 [phone]

## 2013-11-04 NOTE — ED Notes (Signed)
Pt has areas of breakdown on his inner thighs, and around his scrotum and penis. This RN cleaned the pt the best she could. The pt's facility has been using baby powder on the pt. No breakdown observed on the pt's buttocks.

## 2013-11-04 NOTE — ED Notes (Signed)
Pt given [place] sandwich and coke. Pt's family member to assist him with feeding.

## 2013-11-05 ENCOUNTER — Encounter (HOSPITAL_COMMUNITY): Payer: Self-pay | Admitting: Emergency Medicine

## 2013-11-05 ENCOUNTER — Emergency Department (HOSPITAL_COMMUNITY)
Admission: EM | Admit: 2013-11-05 | Discharge: 2013-11-05 | Disposition: A | Discharge status: Home / Self Care | Payer: Medicare Other | Attending: Emergency Medicine | Admitting: Emergency Medicine

## 2013-11-05 DIAGNOSIS — I509 Heart failure, unspecified: Secondary | ICD-10-CM | POA: Insufficient documentation

## 2013-11-05 DIAGNOSIS — Y849 Medical procedure, unspecified as the cause of abnormal reaction of the patient, or of later complication, without mention of misadventure at the time of the procedure: Secondary | ICD-10-CM | POA: Insufficient documentation

## 2013-11-05 DIAGNOSIS — L539 Erythematous condition, unspecified: Secondary | ICD-10-CM | POA: Insufficient documentation

## 2013-11-05 DIAGNOSIS — F329 Major depressive disorder, single episode, unspecified: Secondary | ICD-10-CM | POA: Diagnosis not present

## 2013-11-05 DIAGNOSIS — I498 Other specified cardiac arrhythmias: Secondary | ICD-10-CM | POA: Diagnosis not present

## 2013-11-05 DIAGNOSIS — F3289 Other specified depressive episodes: Secondary | ICD-10-CM | POA: Diagnosis not present

## 2013-11-05 DIAGNOSIS — E559 Vitamin D deficiency, unspecified: Secondary | ICD-10-CM | POA: Diagnosis not present

## 2013-11-05 DIAGNOSIS — I4891 Unspecified atrial fibrillation: Secondary | ICD-10-CM | POA: Insufficient documentation

## 2013-11-05 DIAGNOSIS — Z79899 Other long term (current) drug therapy: Secondary | ICD-10-CM | POA: Diagnosis not present

## 2013-11-05 DIAGNOSIS — Z950 Presence of cardiac pacemaker: Secondary | ICD-10-CM | POA: Insufficient documentation

## 2013-11-05 DIAGNOSIS — Z881 Allergy status to other antibiotic agents status: Secondary | ICD-10-CM | POA: Insufficient documentation

## 2013-11-05 DIAGNOSIS — IMO0002 Reserved for concepts with insufficient information to code with codable children: Secondary | ICD-10-CM | POA: Diagnosis not present

## 2013-11-05 DIAGNOSIS — Z888 Allergy status to other drugs, medicaments and biological substances status: Secondary | ICD-10-CM | POA: Insufficient documentation

## 2013-11-05 DIAGNOSIS — R21 Rash and other nonspecific skin eruption: Secondary | ICD-10-CM | POA: Diagnosis not present

## 2013-11-05 DIAGNOSIS — I999 Unspecified disorder of circulatory system: Secondary | ICD-10-CM | POA: Diagnosis present

## 2013-11-05 DIAGNOSIS — R58 Hemorrhage, not elsewhere classified: Secondary | ICD-10-CM

## 2013-11-05 DIAGNOSIS — M129 Arthropathy, unspecified: Secondary | ICD-10-CM | POA: Diagnosis not present

## 2013-11-05 DIAGNOSIS — I6200 Nontraumatic subdural hemorrhage, unspecified: Secondary | ICD-10-CM | POA: Insufficient documentation

## 2013-11-05 DIAGNOSIS — K5289 Other specified noninfective gastroenteritis and colitis: Secondary | ICD-10-CM | POA: Insufficient documentation

## 2013-11-05 DIAGNOSIS — R609 Edema, unspecified: Secondary | ICD-10-CM | POA: Insufficient documentation

## 2013-11-05 DIAGNOSIS — L02419 Cutaneous abscess of limb, unspecified: Secondary | ICD-10-CM | POA: Diagnosis not present

## 2013-11-05 DIAGNOSIS — I10 Essential (primary) hypertension: Secondary | ICD-10-CM | POA: Diagnosis not present

## 2013-11-05 DIAGNOSIS — I428 Other cardiomyopathies: Secondary | ICD-10-CM | POA: Insufficient documentation

## 2013-11-05 DIAGNOSIS — Z7982 Long term (current) use of aspirin: Secondary | ICD-10-CM | POA: Diagnosis not present

## 2013-11-05 DIAGNOSIS — Z792 Long term (current) use of antibiotics: Secondary | ICD-10-CM | POA: Insufficient documentation

## 2013-11-05 DIAGNOSIS — Z8701 Personal history of pneumonia (recurrent): Secondary | ICD-10-CM | POA: Insufficient documentation

## 2013-11-05 DIAGNOSIS — Z87891 Personal history of nicotine dependence: Secondary | ICD-10-CM | POA: Insufficient documentation

## 2013-11-05 DIAGNOSIS — L03119 Cellulitis of unspecified part of limb: Secondary | ICD-10-CM

## 2013-11-05 DIAGNOSIS — I059 Rheumatic mitral valve disease, unspecified: Secondary | ICD-10-CM | POA: Insufficient documentation

## 2013-11-05 LAB — CBC
HCT: 32.7 % — ABNORMAL LOW (ref 39.0–52.0)
Hemoglobin: 10.5 g/dL — ABNORMAL LOW (ref 13.0–17.0)
MCH: 28.5 pg (ref 26.0–34.0)
MCHC: 32.1 g/dL (ref 30.0–36.0)
MCV: 88.6 fL (ref 78.0–100.0)
PLATELETS: 229 10*3/uL (ref 150–400)
RBC: 3.69 MIL/uL — ABNORMAL LOW (ref 4.22–5.81)
RDW: 15.3 % (ref 11.5–15.5)
WBC: 7.5 10*3/uL (ref 4.0–10.5)

## 2013-11-05 LAB — BASIC METABOLIC PANEL
BUN: 16 mg/dL (ref 6–23)
CALCIUM: 8.4 mg/dL (ref 8.4–10.5)
CO2: 26 meq/L (ref 19–32)
CREATININE: 0.68 mg/dL (ref 0.50–1.35)
Chloride: 104 mEq/L (ref 96–112)
GFR calc Af Amer: >90 mL/min (ref >90)
GFR calc non Af Amer: 83 mL/min — ABNORMAL LOW (ref >90)
Glucose, Bld: 83 mg/dL (ref 70–99)
Potassium: 3.6 mEq/L — ABNORMAL LOW (ref 3.7–5.3)
Sodium: 143 mEq/L (ref 137–147)

## 2013-11-05 MED ORDER — SODIUM CHLORIDE 0.9 % IV BOLUS (SEPSIS)
1000.0000 mL | Freq: Once | INTRAVENOUS | Status: AC
  Administered 2013-11-05: 1000 mL via INTRAVENOUS

## 2013-11-05 MED ORDER — SODIUM CHLORIDE 0.9 % IV BOLUS (SEPSIS)
500.0000 mL | Freq: Once | INTRAVENOUS | Status: AC
  Administered 2013-11-05: 500 mL via INTRAVENOUS

## 2013-11-05 NOTE — Discharge Instructions (Signed)
Please return to ED for new bleeding that does not stop after 30 minutes of pressure.

## 2013-11-05 NOTE — ED Notes (Signed)
Per EMS: Pt from [place] facility where PICC line was being inserted for IV antibiotics, nurse at facility states active, pulsating bleeding from the sight. Upon EMS arrival, EMS noted weak  bilateral radial pulses. Tourniquet applied at the facility by EMS. Pt axo, nad noted.

## 2013-11-05 NOTE — ED Notes (Signed)
Per MD patient leaving with IV in to go back to facility. Patient going back to facility by [facility]

## 2013-11-05 NOTE — ED Notes (Addendum)
IV team called to state that PICC nurse was at [facility], and would not be available after 7pm. States PICC can be placed no earlier than tomorrow. Dr. Clayborne Dana notified.

## 2013-11-05 NOTE — ED Notes (Signed)
Spoke to Merchandiser, retail at [facility]. They will accept patient with IV in place.

## 2013-11-05 NOTE — ED Provider Notes (Signed)
CSN: [id number]     Arrival date & time 11/05/13  1606 History   First MD Initiated Contact with Patient 11/05/13 1611     Chief Complaint  Patient presents with  . bleeding   . Vascular Access Problem     (Consider location/radiation/quality/duration/timing/severity/associated sxs/prior Treatment) Patient is a 80 y.o. male presenting with general illness.  Illness Location:  Right medial arm Quality:  Bleeding Severity:  Severe Progression:  Resolved Chronicity:  New Context:  After picc line insertion Associated symptoms: no abdominal pain, no cough, no ear pain, no fever, no nausea, no rash and no wheezing    Pt from [place] facility where PICC line was being inserted for IV antibiotics, nurse at facility states active, pulsating bleeding from the site. Wife states approx 20-30 minutes of bleeding, appeared bright red and pulsatile. Upon EMS arrival, still bleeding, BP 90/60 EMS noted weak bilateral radial pulses. Tourniquet applied at the facility by EMS at 1542.   Past Medical History  Diagnosis Date  . Permanent atrial fibrillation     NOT CANDIDATE FOR COUMADIN  . Bradycardia     AV JUNCTION ABLATION W/RESUMPTION ON CONDUCTION S/P StJUDE PACEMAKER-2011 REVISION/St.JUDE ACCENT SR  . Hypertension   . Subdural hematoma 2003    bilateral, WHILE ON COUMADIN  . Colitis   . Cardiomyopathy     EF had been 35% (nonischemic, cath 09).  Last echo 2010 EF 55%  . Mitral regurgitation     Moderate  . Arthritis   . Depression   . Congestive heart failure   . Atrial fibrillation   . Vitamin D deficiency   . Pneumonia 01/13/12    being treated for pneumonia  . Pacemaker    Past Surgical History  Procedure Laterality Date  . Subdural hematoma evacuation via craniotomy    . Cystectomy      BREAST, NECK  . Hand surgery    . Insert / replace / remove pacemaker      PACEMAKER IMPLANT 2003-2010.Marland KitchenMarland KitchenREVISION St, JUDE ACCENT SR  . Tonsillectomy and adenoidectomy    . Orif ankle  fracture  01/04/2012    Procedure: OPEN REDUCTION INTERNAL FIXATION (ORIF) ANKLE FRACTURE;  Surgeon: Eldred Manges, MD;  Location: [facility];  Service: Orthopedics;  Laterality: Right;  bilateral fixation trimalleolar fixation  . Orif ankle fracture  01/14/2012    Procedure: OPEN REDUCTION INTERNAL FIXATION (ORIF) ANKLE FRACTURE;  Surgeon: Eldred Manges, MD;  Location: [facility] OR;  Service: Orthopedics;  Laterality: Right;  Right Ankle Fracture Revision   Family History  Problem Relation Age of Onset  . Leukemia Father   . Heart disease Father     angina  . Stroke Mother    History  Substance Use Topics  . Smoking status: Former Smoker    Types: Cigarettes  . Smokeless tobacco: Not on file  . Alcohol Use: No    Review of Systems  Constitutional: Negative for fever.  HENT: Negative for ear pain.   Eyes: Negative for pain.  Respiratory: Negative for cough and wheezing.   Cardiovascular: Positive for leg swelling (and erythema at site of cellulitis).  Gastrointestinal: Negative for nausea and abdominal pain.  Endocrine: Negative for polydipsia and polyuria.  Musculoskeletal: Negative for back pain and neck pain.  Skin: Negative for rash.  Psychiatric/Behavioral: Negative for confusion and agitation.      Allergies  Celecoxib; Doxycycline; Warfarin and related; Warfarin sodium; and Zaroxolyn  Home Medications   Prior to Admission medications  Medication Sig Start Date End Date Taking? Authorizing Provider  acetaminophen (TYLENOL) 325 MG tablet Take 650 mg by mouth every 6 (six) hours as needed for fever. 2 tablets by mouth every six hours as needed    Historical Provider, MD  aspirin 81 MG EC tablet Take 81 mg by mouth daily.     Historical Provider, MD  CALCIUM-VITAMIN D PO Take 1 tablet by mouth daily. 600/800 mg    Historical Provider, MD  carvedilol (COREG) 25 MG tablet Take 25 mg by mouth 2 (two) times daily with a meal.  05/14/09   Historical Provider, MD  cephALEXin (KEFLEX) 500  MG capsule Take 500 mg by mouth See admin instructions. Take 1 tablet by mouth q6h for 7 days.. According to the [facility] PcMAR looks like the patient started on 10-31-13. 11/01/13   Randall Hissornelius N Van Dam, MD  diphenhydrAMINE (BENADRYL) 25 mg capsule Take 25 mg by mouth every 6 (six) hours as needed for itching.     Historical Provider, MD  docusate sodium (COLACE) 100 MG capsule Take 100 mg by mouth 2 (two) times daily.    Historical Provider, MD  enalapril (VASOTEC) 10 MG tablet Take 10 mg by mouth daily.      Historical Provider, MD  enalapril (VASOTEC) 10 MG tablet Take 10 mg by mouth daily.  06/23/09   Historical Provider, MD  escitalopram (LEXAPRO) 10 MG tablet Take 10 mg by mouth daily.     Historical Provider, MD  folic acid (FOLVITE) 400 MCG tablet Take 800 mcg by mouth 3 (three) times daily. 2 tablets by mouth three times daily    Historical Provider, MD  furosemide (LASIX) 40 MG tablet Take 40 mg by mouth daily.  04/15/09   Historical Provider, MD  ipratropium-albuterol (DUONEB) 0.5-2.5 (3) MG/3ML SOLN Take 3 mLs by nebulization every 6 (six) hours as needed. For wheezing or shortness of breath    Historical Provider, MD  Boris LownKrill Oil 300 MG CAPS Take 3 capsules by mouth daily.    Historical Provider, MD  magnesium oxide (MAG-OX) 400 MG tablet Take 400 mg by mouth daily.    Historical Provider, MD  mirtazapine (REMERON) 7.5 MG tablet Take 7.5 mg by mouth at bedtime.    Historical Provider, MD  Multiple Vitamin (MULTIVITAMIN) tablet Take 1 tablet by mouth daily.      Historical Provider, MD  omeprazole (PRILOSEC OTC) 20 MG tablet Take 20 mg by mouth daily. 11/16/12   Historical Provider, MD  oxyCODONE-acetaminophen (PERCOCET/ROXICET) 5-325 MG per tablet Take 1 tablet by mouth every 4 (four) hours as needed for moderate pain.     Historical Provider, MD  Polyethyl Glycol-Propyl Glycol (SYSTANE) 0.4-0.3 % SOLN Place 1 drop into both eyes 2 (two) times daily.    Historical Provider, MD  polyethylene glycol (MIRALAX  / GLYCOLAX) packet Take 17 g by mouth daily.    Historical Provider, MD  potassium chloride SA (K-DUR,KLOR-CON) 20 MEQ tablet Take 20 mEq by mouth daily.    Historical Provider, MD  promethazine (PHENERGAN) 25 MG suppository Place 25 mg rectally every 6 (six) hours as needed for nausea or vomiting.    Historical Provider, MD  promethazine (PHENERGAN) 25 MG tablet Take 25 mg by mouth every 6 (six) hours as needed for nausea or vomiting.    Historical Provider, MD  pyridOXINE (VITAMIN B-6) 50 MG tablet Take 50 mg by mouth daily.    Historical Provider, MD   BP 98/50  Pulse 81  Temp(Src) 97  F (36.1 C) (Temporal)  Resp 15  SpO2 95% Physical Exam  Nursing note and vitals reviewed. Constitutional: He is oriented to person, place, and time. He appears well-developed and well-nourished.  HENT:  Head: Normocephalic and atraumatic.  Eyes: Conjunctivae and EOM are normal. Pupils are equal, round, and reactive to light.  Neck: Normal range of motion.  Cardiovascular: Normal rate and regular rhythm.   Pulmonary/Chest: Effort normal and breath sounds normal.  Abdominal: Soft. He exhibits no distension. There is no tenderness.  Musculoskeletal: Normal range of motion. He exhibits tenderness (over right lower leg). He exhibits no edema.  Neurological: He is alert and oriented to person, place, and time.  Skin: Skin is warm and dry. Rash noted. There is erythema.    ED Course  Procedures (including critical care time) Labs Review Labs Reviewed  CBC - Abnormal; Notable for the following:    RBC 3.69 (*)    Hemoglobin 10.5 (*)    HCT 32.7 (*)    All other components within normal limits  BASIC METABOLIC PANEL - Abnormal; Notable for the following:    Potassium 3.6 (*)    GFR calc non Af Amer 83 (*)    All other components within normal limits    Imaging Review Ct Ankle Right W Contrast  11/04/2013   CLINICAL DATA:  Right ankle erythema and inflammation. Evaluate for hardware infection.   EXAM: CT OF THE RIGHT  ANKLE WITH CONTRAST  TECHNIQUE: Multidetector CT imaging of the ankle was performed following the standard protocol during bolus administration of intravenous contrast.  CONTRAST:  80mL OMNIPAQUE IOHEXOL 300 MG/ML  SOLN  COMPARISON:  Radiographs 08/06/2013, 01/14/2012 and 01/04/2012  FINDINGS: Most of the previously demonstrated hardware has been removed in the interval. There is a single cortical screw traversing the tibial plafond and a single residual screw within the distal fibula. There is no significant focal lucency surrounding the screws or hardware displacement.  The bones are diffusely demineralized. There is posttraumatic deformity of the distal tibia and fibula. The tibial plafond demonstrates significant articular surface irregularity and depression posteriorly, best seen on the sagittal images. There is approximately 1.5 cm of posterior subluxation of the talus with respect to the tibial plafond.  No acute fracture, frank dislocation or gross bone destruction is seen.  The lower leg musculature appears diffusely atrophied. The ankle tendons appear intact. There is no large joint effusion or focal soft tissue swelling.  IMPRESSION: 1. Interval removal of the lateral fibular plate and multiple cortical screws from the distal fibula and tibia. 2. The remaining cortical screws demonstrate no surrounding lucency or displacement. 3. Marked generalized osseous demineralization without evidence of focal bone destruction to suggest osteomyelitis. No evidence of soft tissue abscess. 4. Posttraumatic deformity of the tibial plafond posteriorly and chronic posterior subluxation of the talus.   Electronically Signed   By: Roxy Horseman M.D.   On: 11/04/2013 19:11     EKG Interpretation   Date/Time:  Saturday November 05 2013 16:12:21 EDT Ventricular Rate:  83 PR Interval:    QRS Duration: 109 QT Interval:  403 QTC Calculation: 473 R Axis:   64 Text Interpretation:  Afib/flut and  V-paced complexes No further rhythm  analysis attempted due to paced rhythm Repol abnrm, global ischemia,  diffuse leads , new T wave inversion Anterior leads Confirmed by Anitra Lauth   MD, Alphonzo Lemmings ([id number]) on 11/05/2013 4:19:06 PM      MDM   Final diagnoses:  Bleeding  80 yo M w/ recent visit here for bleeding complication during PICC line attempt. Initially pulsatile bright red blood, resolved now after tourniquet for 30 minutes. BP 90/60 there, improved now. Not tachycardic. Strong radial pulse, large hematoma around insertion site. Tourniquet taken down at around 1625. Will get cbc to eval for blood loss anemia. Give a small bolus. Attempt PICC line here if team is available while we are observing him.   No PICC team available. Bleeding controlled. Small hb drop, bp responsive to fluids. Stable. D/c home with IV in place, will need to return Monday for PICC placement. Attempted to call facility to relay info however could not get in touch with them. Will call again tomorrow. Wife knows and understands plan.    Marily Memos, MD 11/05/13 [phone]

## 2013-11-05 NOTE — ED Provider Notes (Signed)
Medical screening examination/treatment/procedure(s) were conducted as a shared visit with resident-physician practitioner(s) and myself.  I personally evaluated the patient during the encounter.  Pt is a 80 y.o. male with pmhx as above presenting with recurrent RLE cellulitis since IV abx d/c'd due to non-functional PICC.  No acute osteo or deep infection on CT. Pt is at baseline mental status, not septic, in NAD on PE. Ortho did not feel he had acute hardware complication and could follow. GMA consulted, rec PICC be placed in ED (unable to do so at this time). As an alternative, pt will be d/c'd back to facility with PIV in placed after IV vanc in dept and PICC will be placed as outpt in conjunction with GMA tomorrow. Pt and family reliable to d/c with PIV.    Shanna Cisco, MD 11/05/13 1118

## 2013-11-05 NOTE — ED Notes (Signed)
Per MD Mesner, leave 1 IV in place for staff at [facility] to give IV antibiotics tonight until patient can get fluoro Guide CV line tomorrow.

## 2013-11-05 NOTE — ED Notes (Signed)
Paged PTAR   

## 2013-11-06 NOTE — ED Provider Notes (Signed)
I saw and evaluated the patient, reviewed the resident's note and I agree with the findings and plan.   EKG Interpretation   Date/Time:  Saturday November 05 2013 16:12:21 EDT Ventricular Rate:  83 PR Interval:    QRS Duration: 109 QT Interval:  403 QTC Calculation: 473 R Axis:   64 Text Interpretation:  Afib/flut and V-paced complexes No further rhythm  analysis attempted due to paced rhythm Repol abnrm, global ischemia,  diffuse leads , new T wave inversion Anterior leads Confirmed by Anitra Lauth   MD, Alphonzo Lemmings ([id number]) on 11/05/2013 4:19:06 PM      Pt with hx of poor picc line placement at facility with arterial bleeding.  Upon arrival here pt is hemostatic with large hematoma.  NV intact.  Pt have IV placed and function for IV vanc.  Will have picc placed on monday  Gwyneth Sprout, MD 11/06/13 [phone]

## 2013-11-07 ENCOUNTER — Encounter (HOSPITAL_COMMUNITY): Payer: Self-pay | Admitting: Emergency Medicine

## 2013-11-07 ENCOUNTER — Emergency Department (HOSPITAL_COMMUNITY)
Admission: EM | Admit: 2013-11-07 | Discharge: 2013-11-07 | Disposition: A | Discharge status: Home / Self Care | Payer: Medicare Other | Attending: Emergency Medicine | Admitting: Emergency Medicine

## 2013-11-07 ENCOUNTER — Emergency Department (HOSPITAL_COMMUNITY): Payer: Medicare Other

## 2013-11-07 DIAGNOSIS — Z792 Long term (current) use of antibiotics: Secondary | ICD-10-CM | POA: Insufficient documentation

## 2013-11-07 DIAGNOSIS — Y849 Medical procedure, unspecified as the cause of abnormal reaction of the patient, or of later complication, without mention of misadventure at the time of the procedure: Secondary | ICD-10-CM | POA: Insufficient documentation

## 2013-11-07 DIAGNOSIS — Z8719 Personal history of other diseases of the digestive system: Secondary | ICD-10-CM | POA: Insufficient documentation

## 2013-11-07 DIAGNOSIS — E559 Vitamin D deficiency, unspecified: Secondary | ICD-10-CM | POA: Insufficient documentation

## 2013-11-07 DIAGNOSIS — IMO0002 Reserved for concepts with insufficient information to code with codable children: Secondary | ICD-10-CM | POA: Insufficient documentation

## 2013-11-07 DIAGNOSIS — F329 Major depressive disorder, single episode, unspecified: Secondary | ICD-10-CM | POA: Insufficient documentation

## 2013-11-07 DIAGNOSIS — M129 Arthropathy, unspecified: Secondary | ICD-10-CM | POA: Insufficient documentation

## 2013-11-07 DIAGNOSIS — I4891 Unspecified atrial fibrillation: Secondary | ICD-10-CM | POA: Insufficient documentation

## 2013-11-07 DIAGNOSIS — Z7982 Long term (current) use of aspirin: Secondary | ICD-10-CM | POA: Insufficient documentation

## 2013-11-07 DIAGNOSIS — Z79899 Other long term (current) drug therapy: Secondary | ICD-10-CM | POA: Insufficient documentation

## 2013-11-07 DIAGNOSIS — L03119 Cellulitis of unspecified part of limb: Principal | ICD-10-CM

## 2013-11-07 DIAGNOSIS — L02419 Cutaneous abscess of limb, unspecified: Secondary | ICD-10-CM | POA: Insufficient documentation

## 2013-11-07 DIAGNOSIS — I509 Heart failure, unspecified: Secondary | ICD-10-CM | POA: Insufficient documentation

## 2013-11-07 DIAGNOSIS — T148XXA Other injury of unspecified body region, initial encounter: Secondary | ICD-10-CM

## 2013-11-07 DIAGNOSIS — I10 Essential (primary) hypertension: Secondary | ICD-10-CM | POA: Insufficient documentation

## 2013-11-07 DIAGNOSIS — Z95828 Presence of other vascular implants and grafts: Secondary | ICD-10-CM

## 2013-11-07 DIAGNOSIS — R011 Cardiac murmur, unspecified: Secondary | ICD-10-CM | POA: Insufficient documentation

## 2013-11-07 DIAGNOSIS — Z87891 Personal history of nicotine dependence: Secondary | ICD-10-CM | POA: Insufficient documentation

## 2013-11-07 DIAGNOSIS — Z8701 Personal history of pneumonia (recurrent): Secondary | ICD-10-CM | POA: Insufficient documentation

## 2013-11-07 DIAGNOSIS — Z950 Presence of cardiac pacemaker: Secondary | ICD-10-CM | POA: Insufficient documentation

## 2013-11-07 DIAGNOSIS — F3289 Other specified depressive episodes: Secondary | ICD-10-CM | POA: Insufficient documentation

## 2013-11-07 LAB — CBC WITH DIFFERENTIAL/PLATELET
Basophils Absolute: 0.1 10*3/uL (ref 0.0–0.1)
Basophils Relative: 1 % (ref 0–1)
EOS ABS: 0.6 10*3/uL (ref 0.0–0.7)
Eosinophils Relative: 7 % — ABNORMAL HIGH (ref 0–5)
HCT: 24.1 % — ABNORMAL LOW (ref 39.0–52.0)
Hemoglobin: 8 g/dL — ABNORMAL LOW (ref 13.0–17.0)
LYMPHS PCT: 22 % (ref 12–46)
Lymphs Abs: 1.9 10*3/uL (ref 0.7–4.0)
MCH: 29.1 pg (ref 26.0–34.0)
MCHC: 33.2 g/dL (ref 30.0–36.0)
MCV: 87.6 fL (ref 78.0–100.0)
Monocytes Absolute: 0.8 10*3/uL (ref 0.1–1.0)
Monocytes Relative: 9 % (ref 3–12)
Neutro Abs: 5.4 10*3/uL (ref 1.7–7.7)
Neutrophils Relative %: 61 % (ref 43–77)
PLATELETS: 188 10*3/uL (ref 150–400)
RBC: 2.75 MIL/uL — ABNORMAL LOW (ref 4.22–5.81)
RDW: 15.5 % (ref 11.5–15.5)
WBC: 8.8 10*3/uL (ref 4.0–10.5)

## 2013-11-07 LAB — BASIC METABOLIC PANEL
BUN: 27 mg/dL — ABNORMAL HIGH (ref 6–23)
CALCIUM: 8.7 mg/dL (ref 8.4–10.5)
CO2: 28 meq/L (ref 19–32)
Chloride: 106 mEq/L (ref 96–112)
Creatinine, Ser: 0.82 mg/dL (ref 0.50–1.35)
GFR calc non Af Amer: 77 mL/min — ABNORMAL LOW (ref >90)
GFR, EST AFRICAN AMERICAN: 89 mL/min — AB (ref >90)
Glucose, Bld: 92 mg/dL (ref 70–99)
Potassium: 4.3 mEq/L (ref 3.7–5.3)
SODIUM: 142 meq/L (ref 137–147)

## 2013-11-07 LAB — SEDIMENTATION RATE: Sed Rate: 30 mm/hr — ABNORMAL HIGH (ref 0–16)

## 2013-11-07 MED ORDER — VANCOMYCIN HCL IN DEXTROSE 1-5 GM/200ML-% IV SOLN
1000.0000 mg | Freq: Once | INTRAVENOUS | Status: AC
  Administered 2013-11-07: 1000 mg via INTRAVENOUS
  Filled 2013-11-07: qty 200

## 2013-11-07 NOTE — ED Notes (Signed)
Per IV team RN pt will need IR for PICC insertion related to pacemaker on left side.

## 2013-11-07 NOTE — ED Notes (Signed)
Bed: [facility] Expected date:  Expected time:  Means of arrival:  Comments: [facility] waiting for [facility]

## 2013-11-07 NOTE — Discharge Instructions (Signed)
Continue vancomycin as ordered

## 2013-11-07 NOTE — ED Provider Notes (Signed)
CSN: [id number]     Arrival date & time 11/07/13  0300 History   First MD Initiated Contact with Patient 11/07/13 0302     Chief Complaint  Patient presents with  . Cellulitis     (Consider location/radiation/quality/duration/timing/severity/associated sxs/prior Treatment) HPI 80 year old male presents to the emergency department due to swollen right arm.  Wife reports that she is concerned for cellulitis in the arm.  Patient has cellulitis ongoing in his right lower leg thought to be secondary to hardware from prior ankle fracture.  Patient was placed on vancomycin through [name] from the first through the eighth when it was stopped due to a nonfunctioning PICC line.  Patient was also on doxycycline but this was stopped due to allergic reaction.  Patient was on Keflex for 7 days stopping on the 14th.  Patient was seen by infectious disease who agreed with continuing the Keflex.  They ordered labs and CT of the ankle.  Patient had worsening swelling and redness of the ankle on the 12th, and he was sent to the emergency department to be seen.  At that time or so was consulted, who said if no obvious hardware complication should be admitted to primary team.  Primary care Dr. on call recommended sending back to nursing home with PICC line in place or IV if the time not available to get vancomycin and nursing facility.  Patient was given a dose of vancomycin at that ED visit and sent back to the nursing facility with an IV in place.  The following day, June 13, a PICC line was attempted at the nursing facility, and patient had complication with arterial bleeding.  Tourniquet applied, patient had brief hypotension which improved with fluids.  No PICC line was able to be placed during the ED visit, and patient was sent back to the nursing facility with a peripheral IV to have his vancomycin given on Sunday, the 14th.  Wife reports she was not comfortable with the nursing facility giving him vancomycin as he  had some redness around the peripheral IV site, and she did not want them to start another peripheral IV at the nursing home given the bleeding event and the day before.  Patient was sent from the nursing facility tonight, early morning on the 15th due to persistent swelling of the right arm, and some warmth.  Patient is supposed to be seen today for a second attempt at PICC line.  Patient has not had any fevers or chills.  His right lower extremity edema and erythema is stable.  Patient has contracture of the right upper arm and prefers to lie on that side. Past Medical History  Diagnosis Date  . Permanent atrial fibrillation     NOT CANDIDATE FOR COUMADIN  . Bradycardia     AV JUNCTION ABLATION W/RESUMPTION ON CONDUCTION S/P StJUDE PACEMAKER-2011 REVISION/St.JUDE ACCENT SR  . Hypertension   . Subdural hematoma 2003    bilateral, WHILE ON COUMADIN  . Colitis   . Cardiomyopathy     EF had been 35% (nonischemic, cath 09).  Last echo 2010 EF 55%  . Mitral regurgitation     Moderate  . Arthritis   . Depression   . Congestive heart failure   . Atrial fibrillation   . Vitamin D deficiency   . Pneumonia 01/13/12    being treated for pneumonia  . Pacemaker    Past Surgical History  Procedure Laterality Date  . Subdural hematoma evacuation via craniotomy    .  Cystectomy      BREAST, NECK  . Hand surgery    . Insert / replace / remove pacemaker      PACEMAKER IMPLANT 2003-2010.Marland KitchenMarland KitchenREVISION St, JUDE ACCENT SR  . Tonsillectomy and adenoidectomy    . Orif ankle fracture  01/04/2012    Procedure: OPEN REDUCTION INTERNAL FIXATION (ORIF) ANKLE FRACTURE;  Surgeon: Eldred Manges, MD;  Location: [facility];  Service: Orthopedics;  Laterality: Right;  bilateral fixation trimalleolar fixation  . Orif ankle fracture  01/14/2012    Procedure: OPEN REDUCTION INTERNAL FIXATION (ORIF) ANKLE FRACTURE;  Surgeon: Eldred Manges, MD;  Location: [facility];  Service: Orthopedics;  Laterality: Right;  Right Ankle Fracture  Revision   Family History  Problem Relation Age of Onset  . Leukemia Father   . Heart disease Father     angina  . Stroke Mother    History  Substance Use Topics  . Smoking status: Former Smoker    Types: Cigarettes  . Smokeless tobacco: Not on file  . Alcohol Use: Yes     Comment: gin and tonic every night    Review of Systems   See History of Present Illness; otherwise all other systems are reviewed and negative  Allergies  Celecoxib; Doxycycline; Warfarin and related; Warfarin sodium; and Zaroxolyn  Home Medications   Prior to Admission medications   Medication Sig Start Date End Date Taking? Authorizing Provider  acetaminophen (TYLENOL) 325 MG tablet Take 650 mg by mouth every 6 (six) hours as needed for fever (for fever).    Yes Historical Provider, MD  aspirin 81 MG chewable tablet Chew 81 mg by mouth daily.   Yes Historical Provider, MD  Calcium Carb-Cholecalciferol (CALTRATE 600+D) 600-800 MG-UNIT TABS Take 1 tablet by mouth daily.   Yes Historical Provider, MD  carvedilol (COREG) 25 MG tablet Take 25 mg by mouth 2 (two) times daily with a meal.  05/14/09  Yes Historical Provider, MD  diphenhydrAMINE (BENADRYL) 25 mg capsule Take 25 mg by mouth every 6 (six) hours as needed for itching.    Yes Historical Provider, MD  docusate sodium (COLACE) 100 MG capsule Take 100 mg by mouth 2 (two) times daily.   Yes Historical Provider, MD  enalapril (VASOTEC) 10 MG tablet Take 10 mg by mouth daily.     Yes Historical Provider, MD  escitalopram (LEXAPRO) 10 MG tablet Take 10 mg by mouth daily.    Yes Historical Provider, MD  Eyelid Cleansers (OCUSOFT EYELID CLEANSING) PADS Apply 1 application topically every Monday, Wednesday, and Friday. Cleanse each eye   Yes Historical Provider, MD  folic acid (FOLVITE) 400 MCG tablet Take 800 mcg by mouth 3 (three) times daily. 2 tablets by mouth three times daily   Yes Historical Provider, MD  furosemide (LASIX) 40 MG tablet Take 40 mg by  mouth daily.   Yes Historical Provider, MD  ipratropium-albuterol (DUONEB) 0.5-2.5 (3) MG/3ML SOLN Take 3 mLs by nebulization every 6 (six) hours as needed. For wheezing or shortness of breath   Yes Historical Provider, MD  Krill Oil 300 MG CAPS Take 3 capsules by mouth daily.   Yes Historical Provider, MD  magnesium oxide (MAG-OX) 400 MG tablet Take 400 mg by mouth daily.   Yes Historical Provider, MD  mirtazapine (REMERON) 7.5 MG tablet Take 7.5 mg by mouth at bedtime.   Yes Historical Provider, MD  Multiple Vitamin (MULTIVITAMIN WITH MINERALS) TABS tablet Take 1 tablet by mouth daily.   Yes Historical Provider, MD  omeprazole (PRILOSEC OTC) 20 MG tablet Take 20 mg by mouth daily. 11/16/12  Yes Historical Provider, MD  oxyCODONE-acetaminophen (PERCOCET/ROXICET) 5-325 MG per tablet Take 1 tablet by mouth every 4 (four) hours as needed for moderate pain.    Yes Historical Provider, MD  Polyethyl Glycol-Propyl Glycol (SYSTANE) 0.4-0.3 % SOLN Place 1 drop into both eyes 2 (two) times daily.   Yes Historical Provider, MD  polyethylene glycol (MIRALAX / GLYCOLAX) packet Take 17 g by mouth daily.   Yes Historical Provider, MD  potassium chloride SA (K-DUR,KLOR-CON) 20 MEQ tablet Take 20 mEq by mouth daily.   Yes Historical Provider, MD  promethazine (PHENERGAN) 25 MG suppository Place 25 mg rectally every 6 (six) hours as needed for nausea or vomiting.   Yes Historical Provider, MD  promethazine (PHENERGAN) 25 MG tablet Take 25 mg by mouth every 6 (six) hours as needed for nausea or vomiting.   Yes Historical Provider, MD  pyridOXINE (VITAMIN B-6) 50 MG tablet Take 50 mg by mouth daily.   Yes Historical Provider, MD  cephALEXin (KEFLEX) 500 MG capsule Take 500 mg by mouth 4 (four) times daily. 7 day therapy course patient completed course on 11/06/13. 11/01/13   Randall Hiss, MD   BP 185/112  Pulse 93  Temp(Src) 97.8 F (36.6 C) (Oral)  Resp 20  SpO2 98% Physical Exam  Nursing note and vitals  reviewed. Constitutional: He is oriented to person, place, and time.  No acute distress, frail elderly male  HENT:  Head: Normocephalic and atraumatic.  Nose: Nose normal.  Mouth/Throat: Oropharynx is clear and moist.  Eyes: Conjunctivae and EOM are normal. Pupils are equal, round, and reactive to light.  Neck: Normal range of motion. Neck supple. No JVD present. No tracheal deviation present. No thyromegaly present.  Cardiovascular: Normal rate, regular rhythm and intact distal pulses.  Exam reveals no gallop and no friction rub.   Murmur heard. Pulmonary/Chest: Effort normal and breath sounds normal. No stridor. No respiratory distress. He has no wheezes. He has no rales. He exhibits no tenderness.  Abdominal: Soft. Bowel sounds are normal. He exhibits no distension and no mass. There is no tenderness. There is no rebound and no guarding.  Musculoskeletal: He exhibits tenderness.  Patient with significant bruising to right upper extremity and edema noted.  There is slight warmth.  There is no overlying cellulitis.  Patient has contracture at elbow and hand.  No infection seen at the puncture site.  No drainage.  Right lower leg smaller swollen than the left.  Incision sites clean dry healed.  There is no crepitus.  The right leg is warmer than the left.  It is slightly more erythematous than the left  Lymphadenopathy:    He has no cervical adenopathy.  Neurological: He is alert and oriented to person, place, and time. He exhibits normal muscle tone. Coordination normal.  Skin: Skin is warm and dry. No rash noted. No erythema. No pallor.  Psychiatric: He has a normal mood and affect. His behavior is normal. Judgment and thought content normal.    ED Course  Procedures (including critical care time) Labs Review Labs Reviewed  CBC WITH DIFFERENTIAL - Abnormal; Notable for the following:    RBC 2.75 (*)    Hemoglobin 8.0 (*)    HCT 24.1 (*)    Eosinophils Relative 7 (*)    All other  components within normal limits  BASIC METABOLIC PANEL - Abnormal; Notable for the following:  BUN 27 (*)    GFR calc non Af Amer 77 (*)    GFR calc Af Amer 89 (*)    All other components within normal limits  SEDIMENTATION RATE - Abnormal; Notable for the following:    Sed Rate 30 (*)    All other components within normal limits    Imaging Review No results found.   EKG Interpretation None      MDM   Final diagnoses:  Cellulitis    80 year old male who has had a complicated course recently of cellulitis in his right lower extremity, on multiple antibiotics currently has been receiving vanc.  He had a bleeding event associated with a missed PICC line placement on Saturday, and family refused antibiotics on Sunday.  He is due to have a PICC line placed today.  Right arm is bruised, swollen consistent with a recent arterial injury.  There is no overwhelming infection of this area at this time.  He has had a 2. drop in his hemoglobin and hematocrit.  We'll touch with his primary care doctor to see if they would like to admit him transfusion or follow along as he has a clear source of his bleeding.  Patient is due to have a PICC line inserted to the hospital today, and we'll hold him from being discharged back to his facility to see if we get that done through the emergency department today.   6:56 AM D/w Dr Jacky KindleAronson.  If patient is unable to have PICC line placed in a timely manner, he should be contacted at [phone] for obs admission.   Olivia Mackielga M Tabria Steines, MD 11/07/13 [phone]

## 2013-11-07 NOTE — ED Notes (Signed)
Bed: YE18 Expected date:  Expected time:  Means of arrival:  Comments: Pt in IR

## 2013-11-07 NOTE — ED Notes (Signed)
Pt/family updated. [facility] backed up but pt is still on list to be transported.

## 2013-11-07 NOTE — ED Notes (Signed)
Pt on way back from IR. Per Anitra Lauth MD pt to be discharged. DO NOT administer vancomycin as previously ordered.

## 2013-11-07 NOTE — ED Notes (Signed)
Per pt's wife, pt had a pressure dressing around right arm from 1500 on 06/13 until 1600 on 6/14 because the RN placing PICC line nicked an artery.

## 2013-11-07 NOTE — ED Notes (Signed)
Per EMS staff at SNF told them that a mobile X-ray RN came on Saturday to place PICC line in pt.  Pt's right arm is now warm, red and swollen.  PICC line no longer in place.

## 2013-11-07 NOTE — ED Notes (Signed)
[facility] called for transportation back to [facility]

## 2013-11-07 NOTE — ED Notes (Signed)
[facility] arrived.  

## 2013-11-07 NOTE — ED Notes (Signed)
Spoke with staff from IR who report pt time frame for PICC insertion around 1300.

## 2013-11-07 NOTE — ED Notes (Signed)
IR called and ready for PICC insertion. Pt currently having brief changed by ED staff and will be transported to IR post event.

## 2013-11-07 NOTE — ED Notes (Signed)
Radiology called and reported that the patient would be placed on a list for PICC line. No specific time given.

## 2013-11-07 NOTE — ED Notes (Signed)
Bed: FM40 Expected date:  Expected time:  Means of arrival:  Comments: EMS 54M arm swelling s/p PICC line attempt

## 2013-11-17 ENCOUNTER — Encounter (HOSPITAL_COMMUNITY): Payer: Medicare Other

## 2013-11-22 ENCOUNTER — Non-Acute Institutional Stay (HOSPITAL_COMMUNITY)
Admission: AD | Admit: 2013-11-22 | Discharge: 2013-11-22 | Disposition: A | Discharge status: Skilled Nursing Facility | Payer: Medicare Other | Source: Ambulatory Visit | Attending: Internal Medicine | Admitting: Internal Medicine

## 2013-11-22 DIAGNOSIS — D649 Anemia, unspecified: Secondary | ICD-10-CM | POA: Diagnosis present

## 2013-11-22 DIAGNOSIS — I509 Heart failure, unspecified: Secondary | ICD-10-CM | POA: Diagnosis not present

## 2013-11-22 LAB — PREPARE RBC (CROSSMATCH)

## 2013-11-22 LAB — ABO/RH: ABO/RH(D): B POS

## 2013-11-22 MED ORDER — HEPARIN SOD (PORK) LOCK FLUSH 100 UNIT/ML IV SOLN
250.0000 [IU] | INTRAVENOUS | Status: AC | PRN
  Administered 2013-11-22: 250 [IU]

## 2013-11-23 LAB — TYPE AND SCREEN
ABO/RH(D): B POS
ANTIBODY SCREEN: NEGATIVE
Unit division: 0
Unit division: 0

## 2013-11-23 NOTE — Care Management Note (Addendum)
    Page 1 of 1   11/23/2013     1:51:28 PM CARE MANAGEMENT NOTE 11/23/2013  Patient:  John [name], John [name]   Account Number:  [id number]  Date Initiated:  11/22/2013  Documentation initiated by:  Donato Schultz  Subjective/Objective Assessment:   Extended Treatment patient here for OP Blood transfusion     Action/Plan:   CM to follow for prn needs   Anticipated DC Date:  11/22/2013   Anticipated DC Plan:  SKILLED NURSING FACILITY  In-house referral  Clinical Social Worker         Choice offered to / List presented to:             Status of service:  Completed, signed off Medicare Important Message given?   (If response is "NO", the following Medicare IM given date fields will be blank) Date Medicare IM given:   Medicare IM given by:   Date Additional Medicare IM given:   Additional Medicare IM given by:    Discharge Disposition:  SKILLED NURSING FACILITY  Per UR Regulation:    If discussed at Long Length of Stay Meetings, dates discussed:    Comments:  Crystal Hutchinson RN, BSN, MSHL, CCM  Nurse - Case Manager, (Unit [phone]  11/22/2013 Unit questions re:  ambulance transportation back to [facility] - Staff instructed by transport team to call 911 for transport needs after 5pm. CM reported update to SW who will f/u with unit re: plan for tranport arrangements  which will not include a 911 call. SW/Vanessa active.   Patient was OP for a blood transfusion - Extended Treatment Status Returned back to SNF following transfusion.

## 2013-11-29 ENCOUNTER — Telehealth: Payer: Self-pay | Admitting: *Deleted

## 2013-11-29 NOTE — Telephone Encounter (Signed)
The pharmacy at [facility] wants to get a stop date as the patient had a date of 11/28/13 and he does not want to stop because that last time he did the infection came back. He also told them that at his last visit e thought Dr Daiva Eves extended his course. Advised her will have to ask the doctor and get back to her Bjorn Loser [phone].

## 2013-11-29 NOTE — Telephone Encounter (Signed)
We need records from [name] office and surgery regarding this patient. I would also feel better extending his abx unti he can be seen by [place] again in [facility]

## 2013-11-30 NOTE — Telephone Encounter (Signed)
Per Dr Daiva Eves called Bjorn Loser back and scheduled a follow up appt for 12/14/13 at 10 am. Also gave verbal to extend the patient IV Vanco per the doctor and called Dr Ophelia Charter office to get notes and they advised last visit 09/2013 and does not have a follow up visit.

## 2013-12-08 ENCOUNTER — Other Ambulatory Visit (HOSPITAL_COMMUNITY): Payer: Self-pay | Admitting: Internal Medicine

## 2013-12-08 ENCOUNTER — Ambulatory Visit (HOSPITAL_COMMUNITY)
Admission: RE | Admit: 2013-12-08 | Discharge: 2013-12-08 | Disposition: A | Discharge status: Home / Self Care | Payer: Medicare Other | Source: Ambulatory Visit | Attending: Internal Medicine | Admitting: Internal Medicine

## 2013-12-08 DIAGNOSIS — I10 Essential (primary) hypertension: Secondary | ICD-10-CM | POA: Diagnosis not present

## 2013-12-08 DIAGNOSIS — I4891 Unspecified atrial fibrillation: Secondary | ICD-10-CM | POA: Diagnosis not present

## 2013-12-08 DIAGNOSIS — I428 Other cardiomyopathies: Secondary | ICD-10-CM | POA: Diagnosis not present

## 2013-12-08 DIAGNOSIS — E559 Vitamin D deficiency, unspecified: Secondary | ICD-10-CM | POA: Insufficient documentation

## 2013-12-08 DIAGNOSIS — Z789 Other specified health status: Secondary | ICD-10-CM

## 2013-12-08 DIAGNOSIS — Z950 Presence of cardiac pacemaker: Secondary | ICD-10-CM | POA: Insufficient documentation

## 2013-12-08 DIAGNOSIS — I998 Other disorder of circulatory system: Secondary | ICD-10-CM | POA: Diagnosis not present

## 2013-12-08 DIAGNOSIS — I509 Heart failure, unspecified: Secondary | ICD-10-CM | POA: Insufficient documentation

## 2013-12-08 NOTE — Procedures (Signed)
Right arm PICC placed in right basilic vein. Tip in SVC and ready to use.  No immediate complication.

## 2013-12-14 ENCOUNTER — Ambulatory Visit (INDEPENDENT_AMBULATORY_CARE_PROVIDER_SITE_OTHER): Payer: Medicare Other | Admitting: Infectious Disease

## 2013-12-14 ENCOUNTER — Encounter: Payer: Self-pay | Admitting: Infectious Disease

## 2013-12-14 VITALS — BP 110/54 | HR 88 | Temp 97.1°F

## 2013-12-14 DIAGNOSIS — S82899A Other fracture of unspecified lower leg, initial encounter for closed fracture: Secondary | ICD-10-CM

## 2013-12-14 DIAGNOSIS — I6200 Nontraumatic subdural hemorrhage, unspecified: Secondary | ICD-10-CM

## 2013-12-14 DIAGNOSIS — T889XXS Complication of surgical and medical care, unspecified, sequela: Secondary | ICD-10-CM

## 2013-12-14 DIAGNOSIS — M869 Osteomyelitis, unspecified: Secondary | ICD-10-CM

## 2013-12-14 DIAGNOSIS — J690 Pneumonitis due to inhalation of food and vomit: Secondary | ICD-10-CM

## 2013-12-14 DIAGNOSIS — M2450 Contracture, unspecified joint: Secondary | ICD-10-CM

## 2013-12-14 DIAGNOSIS — T847XXS Infection and inflammatory reaction due to other internal orthopedic prosthetic devices, implants and grafts, sequela: Secondary | ICD-10-CM

## 2013-12-14 DIAGNOSIS — R259 Unspecified abnormal involuntary movements: Secondary | ICD-10-CM

## 2013-12-14 DIAGNOSIS — S82891A Other fracture of right lower leg, initial encounter for closed fracture: Secondary | ICD-10-CM

## 2013-12-14 DIAGNOSIS — S065XAA Traumatic subdural hemorrhage with loss of consciousness status unknown, initial encounter: Secondary | ICD-10-CM

## 2013-12-14 DIAGNOSIS — J189 Pneumonia, unspecified organism: Secondary | ICD-10-CM

## 2013-12-14 DIAGNOSIS — S065X9A Traumatic subdural hemorrhage with loss of consciousness of unspecified duration, initial encounter: Secondary | ICD-10-CM

## 2013-12-14 DIAGNOSIS — Z889 Allergy status to unspecified drugs, medicaments and biological substances status: Secondary | ICD-10-CM

## 2013-12-14 MED ORDER — SULFAMETHOXAZOLE-TMP DS 800-160 MG PO TABS: 1.0000 | ORAL_TABLET | Freq: Two times a day (BID) | ORAL | Status: DC

## 2013-12-14 NOTE — Progress Notes (Signed)
Subjective:    Patient ID: John [name], male    DOB: 28-Dec-1923, 80 y.o.   MRN: [id number]  HPI   John [name] is a 80 year old man with past medical history significant for dilated cardiomyopathy with A-V block status post pacemaker placement then followed by John [name], mitral regurgitation also with a history of seizures followed by John [name] for primary care at [facility]. He had an ankle fracture that he sustained in 2003 it was fixed with hardware. This was revised in 2013. Unfortunately there was increasing erythema and drainage in the ankle and eventually hardware began to protrude through the skin. He was taken to the operating room by John [name] this may and he performed an incision and debridement and removal of most of the hardware where except for 2 pins.  When to him I did not have access to those records as the surgery was done at the surgical Center. I do not know if cultures were obtained in the operating room or the patient on antibiotics prior to potential cultures having been obtained.  Regardless he was apparently treated with intravenous vancomycin by John [name] with improvement in his surgical site postoperatively after removal of hardware. He then developed erythema around the lateral aspect of the wound with drainage and weeping. He was given various courses of antibiotics including clindamycin and doxycycline. He had a rash with doxycycline to which he apparently had some intolerance and apparently had some type of allergic reaction to the oral clindamycin. He has been seen by John [name] and placed back on intravenous vancomycin which he received up into Sunday. His PICC line stopped functioning and he was given his last dose peripherally 2 days ago.  He had then been apparently been placed on Keflex although I do not have the dosing schema here as his MAR does not have documentation of Keflex.  Patient was referred to [place] for evaluation of this persistent  erythema and with concern for infection and and in particular I have concern for deep infection given the presence of residual hardware.  He ended being placed back on IV vancomycin in the interim and has been on it to this date with nearly 4 months of vancomycin.  CT scan done of the ankle failed to show any evidence of osteomyelitis and did not show any lucencies around the existing screws nor any loosening of the screws to suggest osteomyelitis.  His wife states that his wound continues to drain laterally and she is very upset that this continues to be a problem. She is adamant that John [name] will not perform surgery and that he is insistent that there is no osteomyelitis. I reviewed the films and then again commented that in terms of film evidence there was no radiologic evidence of osteomyelitis.  I cannot appreciate much drainage I saw the patient today in clinic though the wife has been witnessing it she states.  I recommended that he come off IV antibiotics and that we remove the PICC line and place him on oral Bactrim and watch him.  It will help I suggested that the patient could get a second opinion with regards to whether or not he would need surgery or not this is something that the patient and his wife in particular were interested in.  In the interim I also called John [name] as I had his ultrasound and it was held review the case with him.  But it still may the patient really suffered initially  to the subluxed fracture with pins were placed that had protruded through the skin and that he removed that he had he not found much to suggest infection when he did surgery and therefore did not do any cultures the time of surgery.  He felt that there was essentially no for this patient be ambulatory as the patient seems to suffer from chronic contractures and certainly appears to have been made be consistent with parkinsonism on exam.  He stated that if the patient's ankle developed  infection that was not controllable that the most rational option for cure would be a below the knee amputation in particular given the fact the patient does not have a realistic hope of walking again.   Review of Systems  Constitutional: Positive for fatigue. Negative for fever, chills, diaphoresis, activity change, appetite change and unexpected weight change.  HENT: Negative for trouble swallowing.   Respiratory: Negative for cough and wheezing.   Cardiovascular: Negative for chest pain.  Gastrointestinal: Negative for nausea, vomiting, diarrhea and abdominal distention.  Genitourinary: Negative for dysuria.  Musculoskeletal: Positive for arthralgias.  Skin: Positive for rash and wound.  Neurological: Negative for headaches.  Psychiatric/Behavioral: Negative for behavioral problems, confusion and decreased concentration.       Objective:   Physical Exam  Constitutional: He appears well-developed and well-nourished.  HENT:  Head: Normocephalic and atraumatic.  Eyes: Conjunctivae and EOM are normal.  Neck: Normal range of motion. Neck supple. No tracheal deviation present.  Cardiovascular: Normal rate and regular rhythm.   Murmur heard. Pulmonary/Chest: Breath sounds normal. No respiratory distress. He has no wheezes. He has no rales.  Abdominal: Soft. Bowel sounds are normal. He exhibits no distension and no mass. There is no tenderness.  Musculoskeletal: He exhibits edema.  Neurological: He is alert. He displays tremor. He exhibits abnormal muscle tone.  Bradykinesia and Parkinsonian like rigiditiy  Skin: Skin is warm. There is erythema.  Psychiatric: He has a normal mood and affect. His behavior is normal. Judgment and thought content normal.   PICC line is clean dry intact:      Right ankle medial wound last visit    Right ankle medial wound this visit:      Right ankle lateral wound last visit:     Right ankle wound lateral view this visit:           Assessment & Plan:   80 year old man with pacemaker and ankle hardware that began protruding through the skin and was removed by John [name]. He has been treated with a protracted course of IV vancomycin with intermittent oral antibiotics in between and continues to have some erythema around the surgical site and drainage per the wife's report although I could not find much on my exam today. CT scan has not shown any evidence of osteomyelitis with CT with contrast. In (MRI not possible with his pacemaker )  Sedimentation rate was 30 when checked last in June. C-reactive protein was normal at 0.8.  Will discontinue his IV vancomycin and have his skilled nursing facility remove his PICC line and check a sedimentation rate and C-reactive protein again.  Also him on oral Bactrim one double strength tablet twice daily.  Given the patient and otherwise frustration I've offered to have him seen by another surgeon for a second opinion about the need for surgery. I also as mentioned above offered to call John [name] directly as I found to have his cell phone. I talked to John [name] and he was  also happy to see the patient again. He told me that there was evidence for genuine deep infection that the best surgical approach would be likely a below the knee amputation. He however himself felt that there was likely no need for further surgeries based on what I was on him and based on imaging and data that we have to date.  I spent greater than 40 minutes with the patient including greater than 50% of time in face to face counsel of the patient and in coordination of their care.   #1 recurrent erythema and weeping of skin at site where he had a hardware infection:  I would Like it records and from Dr. Ophelia CharterYates with regards to the surgery and a potential cultures that have been done.  Fine continue him on Keflex for now.  We will check a sedimentation rate C-reactive protein metabolic panel and CBC today.  Will  check a CT with contrast of the right ankle.  To remove his PICC line is not functioning  I spent greater than 60 minutes with the patient including greater than 50% of time in face to face counsel of the patient and in coordination of their care.    Mx drug allergies: including to doxy and clindamycin  Pace maker: is clean no evidence for infection.  HCAP/ aspiration PNA: Apparently discovered on a recent chest x-ray film patient currently on levofloxacin and finish a course deferred to primary team.

## 2013-12-16 ENCOUNTER — Telehealth: Payer: Self-pay | Admitting: *Deleted

## 2013-12-16 NOTE — Telephone Encounter (Signed)
Received fax from [facility] on patient's high crp of 25.8. Dr. Drue Second looked at this lab and gave verbal order to repeat the crp next week. Bjorn Loser, RN at [facility] notified. Results to be faxed to this clinic. Wendall Mola

## 2014-01-17 ENCOUNTER — Encounter: Payer: Self-pay | Admitting: Vascular Surgery

## 2014-01-18 ENCOUNTER — Ambulatory Visit (INDEPENDENT_AMBULATORY_CARE_PROVIDER_SITE_OTHER)
Admission: RE | Admit: 2014-01-18 | Discharge: 2014-01-18 | Disposition: A | Discharge status: Home / Self Care | Payer: Medicare Other | Source: Ambulatory Visit | Attending: Vascular Surgery | Admitting: Vascular Surgery

## 2014-01-18 ENCOUNTER — Other Ambulatory Visit: Payer: Self-pay | Admitting: Vascular Surgery

## 2014-01-18 ENCOUNTER — Ambulatory Visit (INDEPENDENT_AMBULATORY_CARE_PROVIDER_SITE_OTHER): Payer: Medicare Other | Admitting: Vascular Surgery

## 2014-01-18 ENCOUNTER — Encounter: Payer: Self-pay | Admitting: Vascular Surgery

## 2014-01-18 ENCOUNTER — Ambulatory Visit (HOSPITAL_COMMUNITY)
Admission: RE | Admit: 2014-01-18 | Discharge: 2014-01-18 | Disposition: A | Discharge status: Skilled Nursing Facility | Payer: Medicare Other | Source: Ambulatory Visit | Attending: Vascular Surgery | Admitting: Vascular Surgery

## 2014-01-18 ENCOUNTER — Encounter (HOSPITAL_COMMUNITY): Payer: Medicare Other

## 2014-01-18 VITALS — BP 116/50 | HR 75 | Resp 18 | Ht 73.0 in | Wt 165.0 lb

## 2014-01-18 DIAGNOSIS — R229 Localized swelling, mass and lump, unspecified: Secondary | ICD-10-CM | POA: Diagnosis present

## 2014-01-18 DIAGNOSIS — R0989 Other specified symptoms and signs involving the circulatory and respiratory systems: Secondary | ICD-10-CM | POA: Insufficient documentation

## 2014-01-18 DIAGNOSIS — I739 Peripheral vascular disease, unspecified: Secondary | ICD-10-CM

## 2014-01-18 DIAGNOSIS — I4891 Unspecified atrial fibrillation: Secondary | ICD-10-CM | POA: Insufficient documentation

## 2014-01-18 DIAGNOSIS — I10 Essential (primary) hypertension: Secondary | ICD-10-CM | POA: Insufficient documentation

## 2014-01-18 DIAGNOSIS — Z9889 Other specified postprocedural states: Secondary | ICD-10-CM | POA: Insufficient documentation

## 2014-01-18 DIAGNOSIS — Z7982 Long term (current) use of aspirin: Secondary | ICD-10-CM | POA: Diagnosis not present

## 2014-01-18 DIAGNOSIS — F1021 Alcohol dependence, in remission: Secondary | ICD-10-CM | POA: Diagnosis not present

## 2014-01-18 DIAGNOSIS — I509 Heart failure, unspecified: Secondary | ICD-10-CM | POA: Insufficient documentation

## 2014-01-18 DIAGNOSIS — M79609 Pain in unspecified limb: Secondary | ICD-10-CM | POA: Insufficient documentation

## 2014-01-18 DIAGNOSIS — M25579 Pain in unspecified ankle and joints of unspecified foot: Secondary | ICD-10-CM | POA: Insufficient documentation

## 2014-01-18 DIAGNOSIS — M25571 Pain in right ankle and joints of right foot: Secondary | ICD-10-CM

## 2014-01-18 DIAGNOSIS — M79669 Pain in unspecified lower leg: Secondary | ICD-10-CM

## 2014-01-18 DIAGNOSIS — Z87891 Personal history of nicotine dependence: Secondary | ICD-10-CM | POA: Insufficient documentation

## 2014-01-18 NOTE — Progress Notes (Signed)
[facility] [place] HISTORY AND PHYSICAL   History of Present Illness:  Patient is a 80 y.o. year old male who presents for evaluation of right leg skin drainage.  Pt has history of ORIF left ankle 2013.  He had some of this hardware removed when it penetrated the skin.   He has intermittently had episodes of swelling and cellulitis since then.  Recent CT showed no abscess or suggestion of osteomyelitis.  2 screws remain.  Overall he is very debilitated and bedridden for several months due to deconditioning.  He lives in a SNF.  He seems to have some dementia.  His wife is present for the visit.  Other medical problems include a fib, hypertension, prior subdural, arthritis, CHF all of which are currently stable.  Past Medical History  Diagnosis Date  . Permanent atrial fibrillation     NOT CANDIDATE FOR COUMADIN  . Bradycardia     AV JUNCTION ABLATION W/RESUMPTION ON CONDUCTION S/P StJUDE PACEMAKER-2011 REVISION/St.JUDE ACCENT SR  . Hypertension   . Subdural hematoma 2003    bilateral, WHILE ON COUMADIN  . Colitis   . Cardiomyopathy     EF had been 35% (nonischemic, cath 09).  Last echo 2010 EF 55%  . Mitral regurgitation     Moderate  . Arthritis   . Depression   . Congestive heart failure   . Atrial fibrillation   . Vitamin D deficiency   . Pneumonia 01/13/12    being treated for pneumonia  . Pacemaker     Past Surgical History  Procedure Laterality Date  . Subdural hematoma evacuation via craniotomy    . Cystectomy      BREAST, NECK  . Hand surgery    . Insert / replace / remove pacemaker      PACEMAKER IMPLANT 2003-2010.Marland KitchenMarland KitchenREVISION St, JUDE ACCENT SR  . Tonsillectomy and adenoidectomy    . Orif ankle fracture  01/04/2012    Procedure: OPEN REDUCTION INTERNAL FIXATION (ORIF) ANKLE FRACTURE;  Surgeon: Eldred Manges, MD;  Location: [facility];  Service: Orthopedics;  Laterality: Right;  bilateral fixation trimalleolar fixation  . Orif ankle fracture  01/14/2012     Procedure: OPEN REDUCTION INTERNAL FIXATION (ORIF) ANKLE FRACTURE;  Surgeon: Eldred Manges, MD;  Location: [facility] OR;  Service: Orthopedics;  Laterality: Right;  Right Ankle Fracture Revision    Social History History  Substance Use Topics  . Smoking status: Former Smoker    Types: Cigarettes    Quit date: 01/18/1974  . Smokeless tobacco: Never Used  . Alcohol Use: Yes     Comment: gin and tonic every night    Family History Family History  Problem Relation Age of Onset  . Leukemia Father   . Heart disease Father     angina  . Stroke Mother     Allergies  Allergies  Allergen Reactions  . Celecoxib Other (See Comments)    Stomach bleed  . Doxycycline   . Warfarin And Related Other (See Comments)    Other Reaction: OTHER REACTION  . Warfarin Sodium Other (See Comments)    Causes heart rate, pace maker  . Zaroxolyn [Metolazone] Other (See Comments)    seizure     Current Outpatient Prescriptions  Medication Sig Dispense Refill  . acetaminophen (TYLENOL) 325 MG tablet Take 650 mg by mouth every 6 (six) hours as needed for fever (for fever).       Marland Kitchen aspirin 81 MG chewable tablet Chew 81 mg by  mouth daily.      . Calcium Carb-Cholecalciferol (CALTRATE 600+D) 600-800 MG-UNIT TABS Take 1 tablet by mouth daily.      . carvedilol (COREG) 25 MG tablet Take 25 mg by mouth 2 (two) times daily with a meal.       . diphenhydrAMINE (BENADRYL) 25 mg capsule Take 25 mg by mouth every 6 (six) hours as needed for itching.       . docusate sodium (COLACE) 100 MG capsule Take 100 mg by mouth 2 (two) times daily.      . enalapril (VASOTEC) 10 MG tablet Take 10 mg by mouth daily.        Marland Kitchen escitalopram (LEXAPRO) 10 MG tablet Take 10 mg by mouth daily.       . Eyelid Cleansers (OCUSOFT EYELID CLEANSING) PADS Apply 1 application topically every Monday, Wednesday, and Friday. Cleanse each eye      . folic acid (FOLVITE) 400 MCG tablet Take 800 mcg by mouth 3 (three) times daily. 2 tablets by  mouth three times daily      . furosemide (LASIX) 40 MG tablet Take 40 mg by mouth daily.      Marland Kitchen ipratropium-albuterol (DUONEB) 0.5-2.5 (3) MG/3ML SOLN Take 3 mLs by nebulization every 6 (six) hours as needed. For wheezing or shortness of breath      . Krill Oil 300 MG CAPS Take 3 capsules by mouth daily.      . magnesium oxide (MAG-OX) 400 MG tablet Take 400 mg by mouth daily.      . mirtazapine (REMERON) 7.5 MG tablet Take 7.5 mg by mouth at bedtime.      . Multiple Vitamin (MULTIVITAMIN WITH MINERALS) TABS tablet Take 1 tablet by mouth daily.      Marland Kitchen omeprazole (PRILOSEC OTC) 20 MG tablet Take 20 mg by mouth daily.      Marland Kitchen oxyCODONE-acetaminophen (PERCOCET/ROXICET) 5-325 MG per tablet Take 1 tablet by mouth every 4 (four) hours as needed for moderate pain.       Bertram Gala Glycol-Propyl Glycol (SYSTANE) 0.4-0.3 % SOLN Place 1 drop into both eyes 2 (two) times daily.      . polyethylene glycol (MIRALAX / GLYCOLAX) packet Take 17 g by mouth daily.      . potassium chloride SA (K-DUR,KLOR-CON) 20 MEQ tablet Take 20 mEq by mouth daily.      . promethazine (PHENERGAN) 25 MG suppository Place 25 mg rectally every 6 (six) hours as needed for nausea or vomiting.      . promethazine (PHENERGAN) 25 MG tablet Take 25 mg by mouth every 6 (six) hours as needed for nausea or vomiting.      . pyridOXINE (VITAMIN B-6) 50 MG tablet Take 50 mg by mouth daily.      Marland Kitchen sulfamethoxazole-trimethoprim (BACTRIM DS) 800-160 MG per tablet Take 1 tablet by mouth 2 (two) times daily.  60 tablet  11   No current facility-administered medications for this visit.    ROS:   General:  No weight loss, Fever, chills  HEENT: No recent headaches, no nasal bleeding, no visual changes, no sore throat  Neurologic: No dizziness, blackouts, seizures. No recent symptoms of stroke or mini- stroke. No recent episodes of slurred speech, or temporary blindness.  Cardiac: No recent episodes of chest pain/pressure, no shortness of  breath at rest.  + shortness of breath with exertion. + history of atrial fibrillation or irregular heartbeat  Vascular: No history of rest pain in feet.  No  history of claudication.  No history of non-healing ulcer, No history of DVT   Pulmonary: No home oxygen, no productive cough, no hemoptysis,  No asthma or wheezing  Musculoskeletal:   Arthritis,  Low back pain,   Joint pain  Hematologic:No history of hypercoagulable state.  No history of easy bleeding.  No history of anemia  Gastrointestinal: No hematochezia or melena,  No gastroesophageal reflux, no trouble swallowing  Urinary:  chronic Kidney disease,  on HD -  MWF or  TTHS,  Burning with urination,  Frequent urination,  Difficulty urinating;   Skin: No rashes  Psychological: No history of anxiety,  No history of depression   Physical Examination  Filed Vitals:   01/18/14 1455  BP: 116/50  Pulse: 75  Resp: 18  Height:  (1.854 m)  Weight: 165 lb (74.844 kg)    Body mass index is 21.77 kg/(m^2).  General:  Alert and oriented, no acute distress HEENT: Normal Neck: No bruit or JVD Pulmonary: Clear to auscultation bilaterally Cardiac: Regular Rate and Rhythm without murmur Abdomen: Soft, non-tender, non-distended, no mass Skin: No rash, scaly yellowish skin ankle/gaiter area right leg, no actual open wounds, no erythema, well healed malleolar incisions Extremity Pulses:  2+ radial, brachial, femoral,3+ popliteal absent dorsalis pedis, posterior tibial pulses bilaterally Musculoskeletal: No deformity 1+ right tibial and ankle edema  Neurologic: Upper and lower extremity motor 5/5 and symmetric  DATA:  Venous duplex done today which I reviewed and interpreted.  + deep vein reflux, no DVT.  Popliteal ultrasound no popliteal aneurysm.  Bilateral ABI triphasic > 1   ASSESSMENT:  Venous stasis changes right leg secondary to deep vein reflux and chronic swelling from prior ankle fracture.   No significant arterial occlusive disease   PLAN:  Bilateral compression stockings prescribed to promote skin integrity and decrease swelling to hopefully decrease recurrent cellulits.  Follow up PRN  Fabienne Bruns, MD [facility] [place] Office: [phone] Pager: [phone]

## 2014-01-25 ENCOUNTER — Encounter: Payer: Self-pay | Admitting: Infectious Disease

## 2014-01-25 ENCOUNTER — Ambulatory Visit (INDEPENDENT_AMBULATORY_CARE_PROVIDER_SITE_OTHER): Payer: Medicare Other | Admitting: Infectious Disease

## 2014-01-25 VITALS — BP 116/73 | HR 73 | Temp 97.6°F | Wt 160.0 lb

## 2014-01-25 DIAGNOSIS — IMO0001 Reserved for inherently not codable concepts without codable children: Secondary | ICD-10-CM

## 2014-01-25 DIAGNOSIS — T814XXS Infection following a procedure, sequela: Principal | ICD-10-CM

## 2014-01-25 DIAGNOSIS — G20 Parkinson's disease: Secondary | ICD-10-CM

## 2014-01-25 DIAGNOSIS — M869 Osteomyelitis, unspecified: Secondary | ICD-10-CM

## 2014-01-25 DIAGNOSIS — T889XXS Complication of surgical and medical care, unspecified, sequela: Secondary | ICD-10-CM

## 2014-01-25 DIAGNOSIS — L89102 Pressure ulcer of unspecified part of back, stage 2: Secondary | ICD-10-CM

## 2014-01-25 DIAGNOSIS — I8310 Varicose veins of unspecified lower extremity with inflammation: Secondary | ICD-10-CM

## 2014-01-25 DIAGNOSIS — T847XXS Infection and inflammatory reaction due to other internal orthopedic prosthetic devices, implants and grafts, sequela: Secondary | ICD-10-CM

## 2014-01-25 DIAGNOSIS — L89899 Pressure ulcer of other site, unspecified stage: Secondary | ICD-10-CM

## 2014-01-25 DIAGNOSIS — I872 Venous insufficiency (chronic) (peripheral): Secondary | ICD-10-CM

## 2014-01-25 DIAGNOSIS — L8992 Pressure ulcer of unspecified site, stage 2: Secondary | ICD-10-CM

## 2014-01-25 MED ORDER — SULFAMETHOXAZOLE-TMP DS 800-160 MG PO TABS: 1.0000 | ORAL_TABLET | Freq: Two times a day (BID) | ORAL | Status: DC

## 2014-01-25 NOTE — Progress Notes (Signed)
Subjective:    Patient ID: John [name], male    DOB: [birth year], 80 y.o.   MRN: [id number]  HPI   John [name] is a 80 year old man with past medical history significant for dilated cardiomyopathy with A-V block status post pacemaker placement then followed by [name], mitral regurgitation also with a history of seizures followed by [name] for primary care at [facility]. He had an ankle fracture that he sustained in 2003 it was fixed with hardware. This was revised in 2013. Unfortunately there was increasing erythema and drainage in the ankle and eventually hardware began to protrude through the skin. He was taken to the operating room by [name] this may and he performed an incision and debridement and removal of most of the hardware where except for 2 pins.  When to him I did not have access to those records as the surgery was done at the surgical Center. I do not know if cultures were obtained in the operating room or the patient on antibiotics prior to potential cultures having been obtained.  Regardless he was apparently treated with intravenous vancomycin by [name] with improvement in his surgical site postoperatively after removal of hardware. He then developed erythema around the lateral aspect of the wound with drainage and weeping. He was given various courses of antibiotics including clindamycin and doxycycline. He had a rash with doxycycline to which he apparently had some intolerance and apparently had some type of allergic reaction to the oral clindamycin. He has been seen by [name] and placed back on intravenous vancomycin which he received for several weeks. His PICC line stopped functioning and he was given his last dose peripherally and He had then been apparently been placed on Keflex although I do not have the dosing schema here as his MAR does not have documentation of Keflex.  Patient was referred to [place] for evaluation of this persistent  erythema and with concern for infection and and in particular I had concern for deep infection given the presence of residual hardware and he  ended being placed back on IV vancomycin in the interim and has been on it to this date with nearly 4 months of vancomycin.  CT scan done of the ankle failed to show any evidence of osteomyelitis and did not show any lucencies around the existing screws nor any loosening of the screws to suggest osteomyelitis.  His wife at that time stated that his wound continues to drain laterally and she was very upset that this continues to be a problem. She was adamant that [name] will not perform surgery and that he is insistent that there is no osteomyelitis. I reviewed the films and then again commented that in terms of film evidence there was no radiologic evidence of osteomyelitis.  I COULD not at that time appreciate much drainage I saw the patient today in clinic though the wife has been witnessing it she states.  I recommended that he come off IV antibiotics and that we remove the PICC line and place him on oral Bactrim and watch him.  Apparently the IV antibiotics were never stopped and the PICC line was not stopped either.  In the interim I also called [name] and I reviewed the case extensively with him  He stated that the patient really suffered initially to the subluxed fracture with pins were placed that had protruded through the skin and that he removed that he had he not found much to  suggest infection when he did surgery and therefore did not do any cultures the time of surgery.  He felt that there was essentially no need  for this patient be ambulatory as the patient seems to suffer from chronic contractures and certainly appears to have been made be consistent with parkinsonism on exam.  He stated that if the patient's ankle developed infection that was not controllable that the most rational option for cure would be a below the knee amputation  in particular given the fact the patient does not have a realistic hope of walking again.  On request of the patient and his wife we had the patient referred to [name] for second opinion. [name] also upon examining the patient reviewing the films and the date of felt that there was also a little too can tell us to believe and deep infection. He also like [name] felt that if we believed or could prove that there is deep infection that the only curative approach would be a below the knee and dictation. The patient and wife were against this at the time.  Since I last saw the patient he apparently still has drainage from the site although it always seems to be happening at an area where there is abrasion occurring.  He is still on IV vancomycin much to my surprise and disappointment.  He is also now developed a decubitus ulcer on his back there is fairly extensive in surface area. He has been placed on ciprofloxacin apparently to treat this. He has not to my knowledge having protective dressings applied to his back and does not have a air mattress. His wife states that the ulceration on the back becomes worse on the weekends when he is turned less frequently. He is without fevers chills nausea   Review of Systems  Constitutional: Positive for fatigue. Negative for fever, chills, diaphoresis, activity change, appetite change and unexpected weight change.  HENT: Negative for trouble swallowing.   Respiratory: Negative for cough and wheezing.   Cardiovascular: Negative for chest pain.  Gastrointestinal: Negative for nausea, vomiting, diarrhea and abdominal distention.  Genitourinary: Negative for dysuria.  Musculoskeletal: Positive for arthralgias.  Skin: Positive for rash and wound.  Neurological: Negative for headaches.  Psychiatric/Behavioral: Negative for behavioral problems, confusion and decreased concentration.       Objective:   Physical Exam  Constitutional: He appears  well-developed and well-nourished.  HENT:  Head: Normocephalic and atraumatic.  Eyes: Conjunctivae and EOM are normal.  Neck: Normal range of motion. Neck supple. No tracheal deviation present.  Cardiovascular: Normal rate and regular rhythm.   Murmur heard. Pulmonary/Chest: Breath sounds normal. No respiratory distress. He has no wheezes. He has no rales.  Abdominal: Soft. Bowel sounds are normal. He exhibits no distension and no mass. There is no tenderness.  Musculoskeletal: He exhibits edema.  Neurological: He is alert. He displays tremor. He exhibits abnormal muscle tone.  Bradykinesia and Parkinsonian like rigiditiy  Skin: Skin is warm. There is erythema.  Psychiatric: He has a normal mood and affect. His behavior is normal. Judgment and thought content normal.   PICC line is clean dry intact:        Right ankle medial wound June visit   Right ankle medial July visit      Right ankle lateral wound June visit     Right ankle wound lateral view July visit     Right lateral ankle wound this visit August 2015:    Back  with stage II decubitus ulcer no purulence        Assessment & Plan:   80 year old man with pacemaker and ankle hardware that began protruding through the skin and was removed by [name]. He has been treated with a protracted course of IV vancomycin with intermittent oral antibiotics in between and continues to have some erythema around the surgical site and drainage per the wife's report although I could not find much on my exam the past few visits. CT scan has not shown any evidence of osteomyelitis with CT with contrast. In (MRI not possible with his pacemaker )  He was seen by [name] for second opinion and [name] concurred with [name] at the only curative option if there indeed was a deep infection would be a below the knee amputation. However like [name] he was skeptical guarding idea of deep infection.  She has also been  seen by vascular surgery and Dr. Darrick Penna is seen the patient he also does not believe there is deep infection but rather largely a problem with venous stasis  Sedimentation rate was 30 when checked last in June. C-reactive protein was normal at 0.8.  Will discontinue his IV vancomycin and have his skilled nursing facility remove his PICC line and check a sedimentation rate and C-reactive protein again.  I had his PICC line removed today and I would like his vancomycin stopped  I will place him on oral Bactrim DS bid though this also may be overkill, it would make me feel better with him being on this esp with his stage II decubitus    I spent greater than 40 minutes with the patient including greater than 50% of time in face to face counsel of the patient and in coordination of their care.  Stage 2 decubitus ulcer: Without fairly large area that is involved.  He needs an air mattress.  He needs to be turned recalling.  He needs to be seen by wound care consult with either inpatient or at the wound care center.  These proper dressing to protect the stage II decubitus ulcer she does not progress and become a deep infection   Mx drug allergies: including to doxy and clindamycin  Pace maker: is clean no evidence for infection.

## 2014-01-25 NOTE — Patient Instructions (Signed)
We will DC PICC  WE will dc vancomycin and ciprofloxacin  We will start bactrim DS one tablet twice daily  YOu need to be seen by Wound Care Consultant either at [facility] or [facility]  YOu need proper dressing for your decubitus ulcer  I would like you to have an air mattress and also be turned frequently, at least every 2 hours and per instructions of [facility] Consultant

## 2014-01-25 NOTE — Progress Notes (Signed)
Per Dr Daiva Eves 37 cm  Single lumen Peripherally Inserted Central Catheter  removed from right basilic . No sutures present. Dressing was clean and dry . Area cleansed with chlorhexidine and petroleum dressing applied. Pt advised no heavy lifting with this arm, leave dressing for 24 hours and call the office if dressing becomes soaked with blood or sharp pain presents.  Pt tolerated procedure well.    Laurell Josephs, RN

## 2014-02-01 ENCOUNTER — Telehealth: Payer: Self-pay | Admitting: *Deleted

## 2014-02-01 NOTE — Telephone Encounter (Signed)
Angelique Blonder RN at [place] called with concerns about patient.  At last office visit 9/2, his picc was pulled, oral antibiotics were started (Bactrim DS BID).  Today the patient's leg is red with Inflammation/swelling (goes down with compression hose).  The NP is concerned that the infection may be coming back, doesn't want to have this run into the weekend without advice. Per Dr. Daiva Eves, Bactrim is d/c, changed to Keflex 500mg  QID for 2 weeks then Keflex 500mg  BID until patient is seen by [facility] on 10/19 at 10:15. Orders faxed to [facility] at [phone], attn Lesli Albee.

## 2014-02-06 ENCOUNTER — Ambulatory Visit: Payer: Medicare Other | Admitting: Podiatry

## 2014-02-14 ENCOUNTER — Encounter (HOSPITAL_BASED_OUTPATIENT_CLINIC_OR_DEPARTMENT_OTHER): Payer: Medicare Other | Attending: General Surgery

## 2014-02-14 DIAGNOSIS — L89109 Pressure ulcer of unspecified part of back, unspecified stage: Secondary | ICD-10-CM | POA: Diagnosis not present

## 2014-02-14 DIAGNOSIS — L8991 Pressure ulcer of unspecified site, stage 1: Secondary | ICD-10-CM | POA: Insufficient documentation

## 2014-02-15 NOTE — H&P (Signed)
NAME:  John [name], BEK NO.:  [id number]  MEDICAL RECORD NO.:  [id number]  LOCATION:  FOOT                         FACILITY:  [facility]  PHYSICIAN:  Joanne Gavel, M.D.        DATE OF BIRTH:  [birth year]  DATE OF ADMISSION:  02/14/2014 DATE OF DISCHARGE:                             HISTORY & PHYSICAL   CHIEF COMPLAINT:  Wound on back.  HISTORY OF PRESENT ILLNESS:  This is an 80 year old male, bedridden because of multiple joint pains who has developed a marked area of violaceous color change on his back.  PAST MEDICAL HISTORY:  Significant for atrial fibrillation, bradycardia, hypertension, subdural hematoma, colitis, cardiomyopathy, mitral regurgitation, arthritis, congestive heart failure, anemia, status post pacemaker implant, and neuropathy.  PAST SURGICAL HISTORY:  Includes craniotomy, cystectomy of breast and neck, hand surgery, pacemaker implantation, tonsillectomy, adenoidectomy, and treatment of a fractured ankle in 2013.  SOCIAL HISTORY:  Cigarettes none.  Alcohol none.  MEDICATIONS:  Colace, Coreg, Vasotec, Lexapro, Folvite, Lasix, DuoNeb, __________, Percocet, K-Dur, Phenergan, multiple vitamins, and Bactrim.  ALLERGIES:  CELECOXIB, DOXYCYCLINE, WARFARIN, ZAROXOLYN.  REVIEW OF SYSTEMS:  As above.  PHYSICAL EXAMINATION:  VITAL SIGNS:  Temperature 97.9, pulse 82, respirations 17, blood pressure 116/93. GENERAL APPEARANCE:  A well developed, in marked __________.  On his back, there is a 21 x 14 area of violaceous discoloration.  This blanches, not particularly tender.  There are no open wounds.  IMPRESSION:  This is a grade 1 decubitus wound possibly developing into something much more serious.  As for now, we will treat with Calmoseptine.  It is very important that nursing service turns him frequently.  We will see him in a month or sooner if the nursing service thinks this is necessary.    Joanne Gavel, M.D.    RA/MEDQ  D:  02/14/2014  T:   02/15/2014  Job:  [id number]

## 2014-03-06 ENCOUNTER — Ambulatory Visit: Payer: Medicare Other | Admitting: Podiatry

## 2014-03-13 ENCOUNTER — Encounter: Payer: Self-pay | Admitting: Infectious Disease

## 2014-03-13 ENCOUNTER — Ambulatory Visit (INDEPENDENT_AMBULATORY_CARE_PROVIDER_SITE_OTHER): Payer: Medicare Other | Admitting: Infectious Disease

## 2014-03-13 VITALS — BP 121/73 | HR 88 | Temp 97.7°F

## 2014-03-13 DIAGNOSIS — I878 Other specified disorders of veins: Secondary | ICD-10-CM | POA: Insufficient documentation

## 2014-03-13 DIAGNOSIS — S82891A Other fracture of right lower leg, initial encounter for closed fracture: Secondary | ICD-10-CM

## 2014-03-13 DIAGNOSIS — T814XXD Infection following a procedure, subsequent encounter: Secondary | ICD-10-CM

## 2014-03-13 DIAGNOSIS — L89503 Pressure ulcer of unspecified ankle, stage 3: Secondary | ICD-10-CM | POA: Insufficient documentation

## 2014-03-13 DIAGNOSIS — IMO0001 Reserved for inherently not codable concepts without codable children: Secondary | ICD-10-CM

## 2014-03-13 DIAGNOSIS — T8149XA Infection following a procedure, other surgical site, initial encounter: Secondary | ICD-10-CM | POA: Insufficient documentation

## 2014-03-13 DIAGNOSIS — L8991 Pressure ulcer of unspecified site, stage 1: Secondary | ICD-10-CM

## 2014-03-13 NOTE — Progress Notes (Signed)
Subjective:    Patient ID: John [name], male    DOB: [birth year], 80 y.o.   MRN: [id number]  HPI   John [name] is a 80 year old man with past medical history significant for dilated cardiomyopathy with A-V block status post pacemaker placement then followed by [name], mitral regurgitation also with a history of seizures followed by [name] for primary care at [facility]. He had an ankle fracture that he sustained in 2003 it was fixed with hardware. This was revised in 2013. Unfortunately there was increasing erythema and drainage in the ankle and eventually hardware began to protrude through the skin. He was taken to the operating room by [name] this may and he performed an incision and debridement and removal of most of the hardware where except for 2 pins.  When to him I did not have access to those records as the surgery was done at the surgical Center. I do not know if cultures were obtained in the operating room or the patient on antibiotics prior to potential cultures having been obtained.  Regardless he was apparently treated with intravenous vancomycin by [name] with improvement in his surgical site postoperatively after removal of hardware. He then developed erythema around the lateral aspect of the wound with drainage and weeping. He was given various courses of antibiotics including clindamycin and doxycycline. He had a rash with doxycycline to which he apparently had some intolerance and apparently had some type of allergic reaction to the oral clindamycin. He has been seen by [name] and placed back on intravenous vancomycin which he received for several weeks. His PICC line stopped functioning and he was given his last dose peripherally and He had then been apparently been placed on Keflex although I do not have the dosing schema here as his MAR does not have documentation of Keflex.  Patient was referred to [place] for evaluation of this persistent  erythema and with concern for infection and and in particular I had concern for deep infection given the presence of residual hardware and he  ended being placed back on IV vancomycin in the interim and has been on it to this date with nearly 4 months of vancomycin.  CT scan done of the ankle failed to show any evidence of osteomyelitis and did not show any lucencies around the existing screws nor any loosening of the screws to suggest osteomyelitis.  His wife at that time stated that his wound continues to drain laterally and she was very upset that this continues to be a problem. She was adamant that [name] will not perform surgery and that he is insistent that there is no osteomyelitis. I reviewed the films and then again commented that in terms of film evidence there was no radiologic evidence of osteomyelitis.  I COULD not at that time appreciate much drainage I saw the patient today in clinic though the wife has been witnessing it she states.  I recommended that he come off IV antibiotics and that we remove the PICC line and place him on oral Bactrim and watch him.  Apparently the IV antibiotics were never stopped and the PICC line was not stopped either.  In the interim I also called [name] and I reviewed the case extensively with him  He stated that the patient really suffered initially to the subluxed fracture with pins were placed that had protruded through the skin and that he removed that he had he not found much to  suggest infection when he did surgery and therefore did not do any cultures the time of surgery.  He felt that there was essentially no need  for this patient be ambulatory as the patient seems to suffer from chronic contractures and certainly appears to have been made be consistent with parkinsonism on exam.  He stated that if the patient's ankle developed infection that was not controllable that the most rational option for cure would be a below the knee amputation  in particular given the fact the patient does not have a realistic hope of walking again.  On request of the patient and his wife we had the patient referred to [name] for second opinion. [name] also upon examining the patient reviewing the films and the date of felt that there was also a little too can tell us to believe and deep infection. He also like [name] felt that if we believed or could prove that there is deep infection that the only curative approach would be a below the knee and dictation. The patient and wife were against this at the time.  Since I last saw the patient he apparently still has drainage from the site although it always seems to be happening at an area where there is abrasion occurring.  He is still on IV vancomycin much to my surprise and disappointment.  He is also now developed a decubitus ulcer on his back there is fairly extensive in surface area. He has been placed on ciprofloxacin apparently to treat this  We made sure that he had a air mattress to help improve the status is decubitus ulcer. He was also sent seen by [name] with dermatology as well as [name] and with the wound care center.  Apparently he was recently diagnosed with pneumonia while in the skilled nursing facility and treated with antibiotics for this.  I do not see the antibiotics and he is on for the pneumonia he was to be on Keflex through today's visit.  On exam today his postoperative surgical site is stable I do not see any evidence of active infection there is a rash on his lower extremity it looks like a contact dermatitis. He also has clear-cut venous stasis as well as onychomycosis  Review of Systems  Constitutional: Positive for fatigue. Negative for fever, chills, diaphoresis, activity change, appetite change and unexpected weight change.  HENT: Negative for trouble swallowing.   Respiratory: Negative for cough and wheezing.   Cardiovascular: Negative for chest  pain.  Gastrointestinal: Negative for nausea, vomiting, diarrhea and abdominal distention.  Genitourinary: Negative for dysuria.  Musculoskeletal: Positive for arthralgias.  Skin: Positive for rash and wound.  Neurological: Negative for headaches.  Psychiatric/Behavioral: Negative for behavioral problems, confusion and decreased concentration.       Objective:   Physical Exam  Constitutional: He appears well-developed and well-nourished.  HENT:  Head: Normocephalic and atraumatic.  Eyes: Conjunctivae and EOM are normal.  Neck: Normal range of motion. Neck supple. No tracheal deviation present.  Cardiovascular: Normal rate and regular rhythm.   Murmur heard. Pulmonary/Chest: Breath sounds normal. No respiratory distress. He has no wheezes. He has no rales.  Abdominal: Soft. Bowel sounds are normal. He exhibits no distension and no mass. There is no tenderness.  Musculoskeletal: He exhibits edema.  Neurological: He is alert. He displays tremor. He exhibits abnormal muscle tone.  Bradykinesia and Parkinsonian like rigiditiy  Skin: Skin is warm. There is erythema.  Psychiatric: He has a normal  mood and affect. His behavior is normal. Judgment and thought content normal.     Right ankle medial wound June visit    Right ankle medial July visit      Right ankle lateral wound June visit     Right ankle wound lateral view July visit     Right lateral ankle wound this visit August 2015:     Wound is stable 03/13/2014:        Back with stage II decubitus ulcer no purulence in June    Decubitus ulcer today [id number]:        Assessment & Plan:   80 year old man with pacemaker and ankle hardware that began protruding through the skin and was removed by Dr. Ophelia CharterYates. He has been treated with a protracted course of IV vancomycin with intermittent oral antibiotics in between and continues to have some erythema around the surgical site and drainage per the wife's  report although I could not find much on my exam the past few visits. CT scan has not shown any evidence of osteomyelitis with CT with contrast. In (MRI not possible with his pacemaker )  He was seen by Dr. Victorino DikeHewitt for second opinion and Dr. Victorino DikeHewitt concurred with Dr. Ophelia CharterYates at the only curative option if there indeed was a deep infection would be a below the knee amputation. However like Dr. Ophelia CharterYates he was skeptical guarding idea of deep infection.  She has also been seen by vascular surgery and Dr. Darrick Pennafields is seen the patient he also does not believe there is deep infection but rather largely a problem with venous stasis  Sedimentation rate was 30 when checked last in June. C-reactive protein was normal at 0.8.  Will discontinue his IV vancomycin and have his skilled nursing facility remove his PICC line and check a sedimentation rate and C-reactive protein again.  PICC line removed and I had placed him on Bactrim but there was concern about recurrence of cellulitis conveyed to us from his skilled nursing facility and so we placed him back on Keflex. Since then he has succumbed to pneumonia and being treated for antibiotics for that.  I think his wound is stable and we can stop his antibiotics and observe him off antibiotics   Postoperative wound:   I think his wound is stable and we can stop his antibiotics and observe him off antibiotics. I spent greater than 25 minutes with the patient including greater than 50% of time in face to face counsel of the patient and in coordination of their care.   Contact dermatitis with venous stasis dermatitis: Deferred to expertise of dermatology wound care, vascular surgery  Decubitus ulcer looks improved with air mattress still needs to be turned followup with wound care   Mx drug allergies: including to doxy and clindamycin  Pace maker: is clean no evidence for infection.

## 2014-03-14 ENCOUNTER — Encounter (HOSPITAL_BASED_OUTPATIENT_CLINIC_OR_DEPARTMENT_OTHER): Payer: Medicare Other | Attending: General Surgery

## 2014-03-15 NOTE — Progress Notes (Signed)
Transfusion done for symptomatic anemia and underlying chf

## 2014-03-24 ENCOUNTER — Other Ambulatory Visit: Payer: Self-pay | Admitting: *Deleted

## 2014-03-24 DIAGNOSIS — I83813 Varicose veins of bilateral lower extremities with pain: Secondary | ICD-10-CM

## 2014-04-10 ENCOUNTER — Ambulatory Visit: Payer: Medicare Other | Admitting: Infectious Disease

## 2014-04-14 ENCOUNTER — Telehealth: Payer: Self-pay | Admitting: Internal Medicine

## 2014-04-14 NOTE — Telephone Encounter (Signed)
LMOM for return call//kwm  

## 2014-04-14 NOTE — Telephone Encounter (Signed)
New Message       Pt's wife calling to verify that pt's device check for tomorrow will be conducted at pt's nursing home. Please call back and advise.

## 2014-04-17 NOTE — Telephone Encounter (Signed)
LMOM//KWM

## 2014-04-17 NOTE — Telephone Encounter (Signed)
Follow Up        Pt's wife returning phone call. Please call back and advise.

## 2014-04-19 ENCOUNTER — Ambulatory Visit (INDEPENDENT_AMBULATORY_CARE_PROVIDER_SITE_OTHER): Payer: Medicare Other | Admitting: *Deleted

## 2014-04-19 DIAGNOSIS — I442 Atrioventricular block, complete: Secondary | ICD-10-CM

## 2014-04-19 LAB — MDC_IDC_ENUM_SESS_TYPE_INCLINIC
Date Time Interrogation Session: 20151125150350
Implantable Pulse Generator Model: 1110
Lead Channel Pacing Threshold Amplitude: 1.25 V
Lead Channel Pacing Threshold Amplitude: 1.25 V
Lead Channel Pacing Threshold Pulse Width: 0.4 ms
Lead Channel Pacing Threshold Pulse Width: 0.4 ms
Lead Channel Sensing Intrinsic Amplitude: 10 mV
Lead Channel Setting Pacing Amplitude: 2.5 V
Lead Channel Setting Pacing Pulse Width: 0.4 ms
MDC IDC MSMT BATTERY REMAINING LONGEVITY: 136.8 mo
MDC IDC MSMT BATTERY VOLTAGE: 2.96 V
MDC IDC MSMT LEADCHNL RV IMPEDANCE VALUE: 550 Ohm
MDC IDC PG SERIAL: 2309283
MDC IDC SET LEADCHNL RV SENSING SENSITIVITY: 2 mV
MDC IDC STAT BRADY RV PERCENT PACED: 31 %

## 2014-04-19 NOTE — Progress Notes (Signed)
Pacemaker check in clinic. Normal device function. Thresholds, sensing, impedances consistent with previous measurements. Device programmed to maximize longevity. A-fib, - coumadin due to his of cerebral bleed.  No high ventricular rates noted. Device programmed at appropriate safety margins. Histogram distribution appropriate for patient activity level. Device programmed to optimize intrinsic conduction. Estimated longevity 11 years.  Patient education completed.  ROV 6 months in nursing home.

## 2014-04-26 NOTE — Telephone Encounter (Signed)
Device checked at [facility] on 04-18-14//kwm

## 2014-05-10 ENCOUNTER — Ambulatory Visit: Payer: Medicare Other | Admitting: Infectious Disease

## 2014-05-24 ENCOUNTER — Encounter: Payer: Self-pay | Admitting: Internal Medicine

## 2014-10-02 ENCOUNTER — Telehealth: Payer: Self-pay | Admitting: Internal Medicine

## 2014-10-02 NOTE — Telephone Encounter (Signed)
New Message  Pt is in [facility] in [place] and will not be able to make his appt. Request a call back to determine if the pt can have a tech to come out or if a remote check will work. Please call    Pt wife would like to add that the Pt would have to be brought in by ambulance on a stretcher.

## 2014-10-03 NOTE — Telephone Encounter (Signed)
Spoke w/pt's wife and pt to be checked at [facility] due to pt not able to come in office. Pt has had pneumonia and upper respiratory infection. Pt to be checked at [facility].

## 2014-10-05 ENCOUNTER — Ambulatory Visit (INDEPENDENT_AMBULATORY_CARE_PROVIDER_SITE_OTHER): Payer: Medicare Other | Admitting: *Deleted

## 2014-10-05 DIAGNOSIS — I481 Persistent atrial fibrillation: Secondary | ICD-10-CM | POA: Diagnosis not present

## 2014-10-05 DIAGNOSIS — I4819 Other persistent atrial fibrillation: Secondary | ICD-10-CM

## 2014-10-05 DIAGNOSIS — I442 Atrioventricular block, complete: Secondary | ICD-10-CM | POA: Diagnosis not present

## 2014-10-05 LAB — CUP PACEART INCLINIC DEVICE CHECK
Battery Voltage: 2.96 V
Brady Statistic RV Percent Paced: 34 %
Date Time Interrogation Session: 20160512161525
Lead Channel Impedance Value: 450 Ohm
Lead Channel Pacing Threshold Amplitude: 1 V
Lead Channel Pacing Threshold Pulse Width: 0.4 ms
Lead Channel Setting Pacing Amplitude: 2.5 V
Lead Channel Setting Pacing Pulse Width: 0.4 ms
Lead Channel Setting Sensing Sensitivity: 2 mV
MDC IDC MSMT BATTERY REMAINING LONGEVITY: 134.4 mo
MDC IDC MSMT LEADCHNL RV SENSING INTR AMPL: 11.3 mV
MDC IDC PG SERIAL: 2309283

## 2014-10-05 NOTE — Progress Notes (Signed)
Pacemaker check in clinic. Normal device function. Thresholds, sensing, impedances consistent with previous measurements. Device programmed to maximize longevity. No high ventricular rates noted. Device programmed at appropriate safety margins. Histogram distribution appropriate for patient activity level. Device programmed to optimize intrinsic conduction. Estimated longevity 10.6 to 11.2 years. ROV in 12 mths--may need to be checked at [facility].

## 2014-11-09 ENCOUNTER — Encounter: Payer: Self-pay | Admitting: Internal Medicine

## 2014-11-10 ENCOUNTER — Encounter: Payer: Self-pay | Admitting: Internal Medicine

## 2015-02-24 DEATH — deceased

## 2016-01-18 IMAGING — US IR US GUIDE VASC ACCESS LEFT
1 series · 2 of 2 positions shown · non-contrast
Comparison: none

CLINICAL DATA: 89-year-old male with cellulitis requiring
outpatient IV antibiotic therapy. He comes to the emergency room
from his outpatient facility after a failed attempted placement of a
PICC.

EXAM:
IR ULTRASOUND GUIDANCE VASC ACCESS LEFT; IR LEFT FLOURO GUIDE CV
LINE
FLUOROSCOPY TIME:  2 minutes 12 seconds
TECHNIQUE: The left arm was prepped with chlorhexidine, draped in the usual
sterile fashion using maximum barrier technique (cap and mask,
sterile gown, sterile gloves, large sterile sheet, hand hygiene and
cutaneous antiseptic). Local anesthesia was attained by infiltration
with 1% lidocaine.

[Series 1: ir fluoro guide cv midline picc *left* · 2 of 2 slices shown]
[im 1/2]
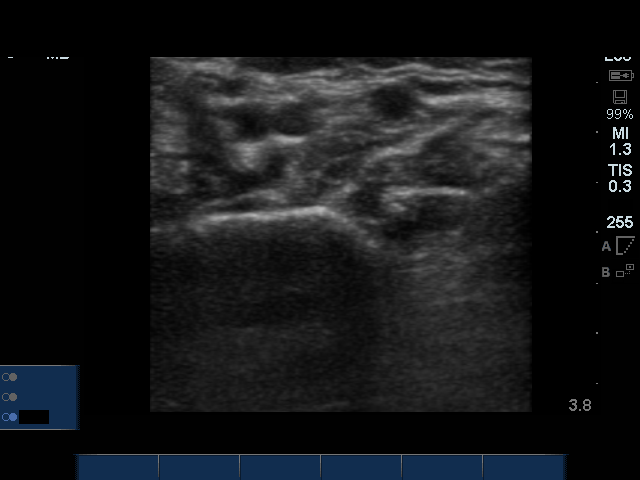
[im 2/2]
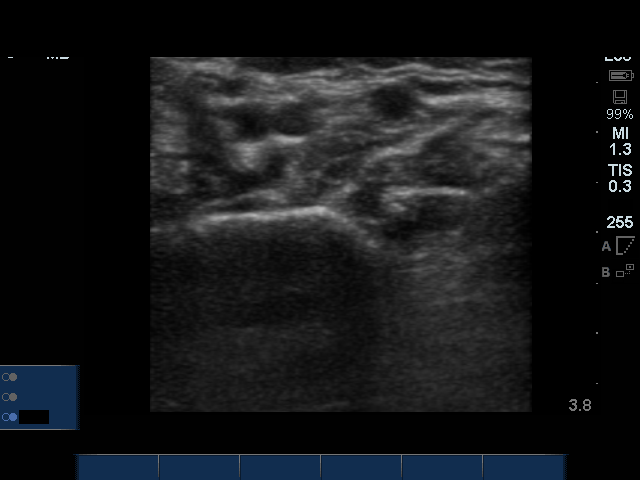

[2 of 2 positions shown; findings below may reference images not displayed]

Ultrasound demonstrated patency of the left basilic vein, and this
was documented with an image. Under real-time ultrasound guidance,
this vein was accessed with a 21 gauge micropuncture needle and
image documentation was performed. The needle was exchanged over a
guidewire for a peel-away sheath through which a 52 cm 4 French
single lumen power injectable PICC was advanced, and positioned with
its tip at the lower SVC/right atrial junction. Fluoroscopy during
the procedure and fluoro spot radiograph confirms appropriate
catheter position. The catheter was flushed, secured to the skin
with Prolene sutures, and covered with a sterile dressing.

COMPLICATIONS:
None.  The patient tolerated the procedure well.
IMPRESSION: Successful placement of a left upper extremity 4 French single-lumen
power PICC with sonographic and fluoroscopic guidance. The catheter
is ready for use.

## 2016-02-18 IMAGING — XA IR FLUORO GUIDE CV LINE*R*
1 series · 1 of 1 positions shown · non-contrast
Comparison: none

CLINICAL DATA: PICC line needed for IV access.

EXAM:
PICC LINE PLACEMENT WITH ULTRASOUND AND FLUOROSCOPIC GUIDANCE
FLUOROSCOPY TIME:  6 seconds
TECHNIQUE: Informed consent was obtained for a PICC line placement. The right
arm was prepped with chlorhexidine, draped in the usual sterile
fashion using maximum barrier technique (cap and mask, sterile gown,
sterile gloves, large sterile sheet, hand hygiene and cutaneous
antiseptic). Local anesthesia was attained by infiltration with 1%
lidocaine.

[Series 1: run · 1 of 1 slices shown]
[im 1/1]
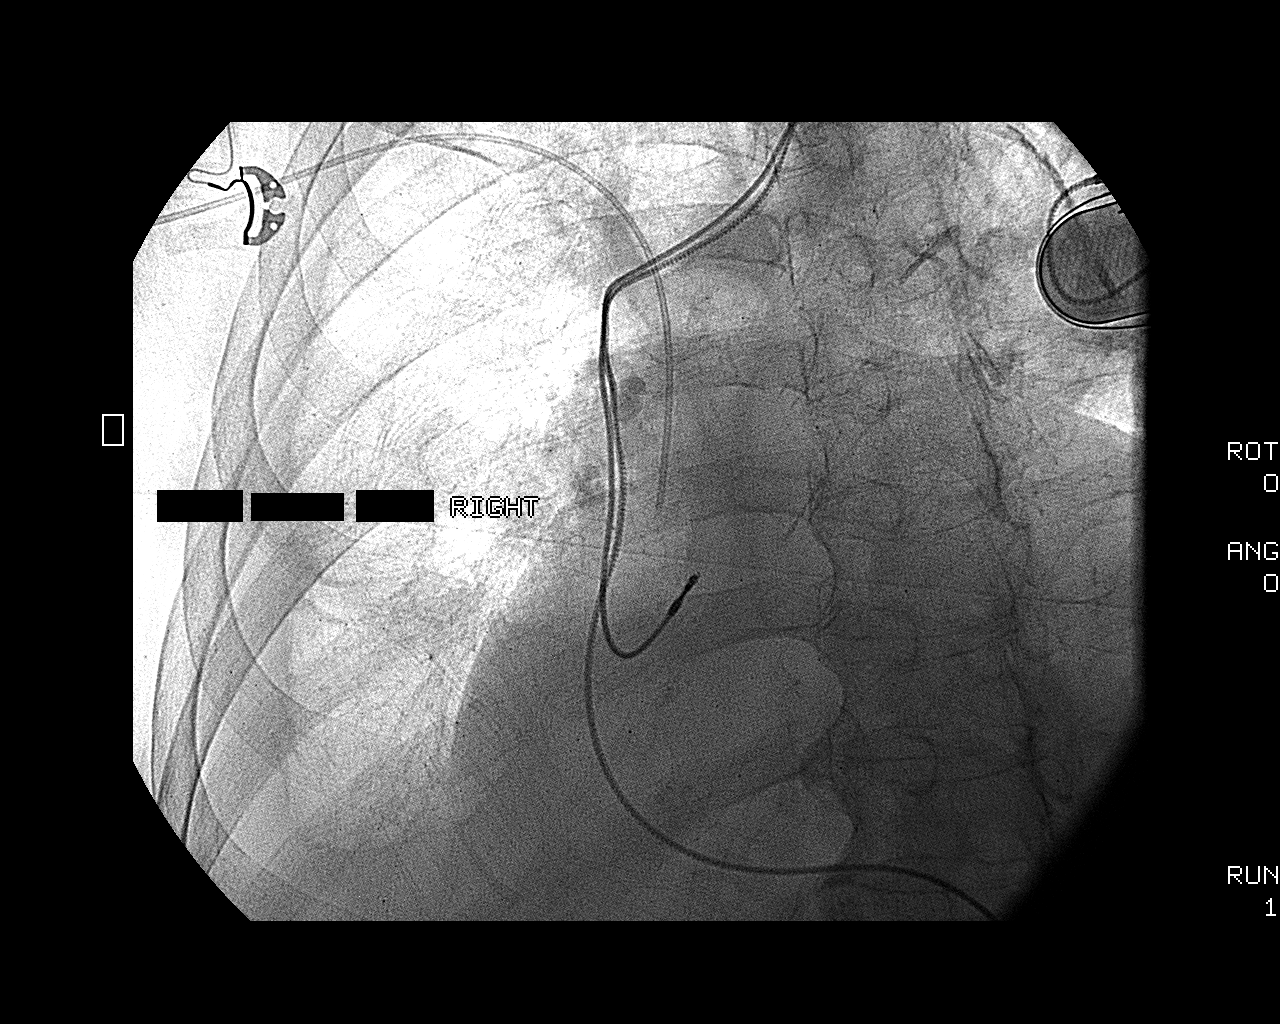

[1 of 1 positions shown; findings below may reference images not displayed]

Ultrasound demonstrated patency of the right basilic vein, and this
was documented with an image. Under real-time ultrasound guidance,
this vein was accessed with a 21 gauge micropuncture needle and
image documentation was performed. The needle was exchanged over a
guidewire for a peel-away sheath through which a 38 cm 5 French
single lumen power injectable PICC was advanced, and positioned with
its tip in the lower SVC. Fluoroscopy during the procedure and
fluoro spot radiograph confirms appropriate catheter position. The
catheter was flushed, secured to the skin with Prolene sutures, and
covered with a sterile dressing.

COMPLICATIONS:
None.  The patient tolerated the procedure well.
IMPRESSION: Successful placement of a right arm PICC with sonographic and
fluoroscopic guidance. The catheter is ready for use.
